# Patient Record
Sex: Male | Born: 1985 | Race: Black or African American | Hispanic: No | Marital: Single | Smoking: Never smoker
Health system: Southern US, Community
[De-identification: ages and names within clinical notes are randomized; demographics above are authoritative.]

## PROBLEM LIST (undated history)

## (undated) DIAGNOSIS — M109 Gout, unspecified: Secondary | ICD-10-CM

## (undated) HISTORY — PX: WISDOM TOOTH EXTRACTION: SHX21

## (undated) HISTORY — PX: NO PAST SURGERIES: SHX2092

---

## 2005-07-02 ENCOUNTER — Emergency Department: Payer: Self-pay | Admitting: Emergency Medicine

## 2005-12-11 ENCOUNTER — Emergency Department: Payer: Self-pay | Admitting: General Practice

## 2007-07-09 ENCOUNTER — Emergency Department: Payer: Self-pay | Admitting: Emergency Medicine

## 2008-03-15 ENCOUNTER — Emergency Department: Payer: Self-pay | Admitting: Emergency Medicine

## 2008-04-14 ENCOUNTER — Emergency Department: Payer: Self-pay | Admitting: Emergency Medicine

## 2008-04-19 ENCOUNTER — Emergency Department: Payer: Self-pay | Admitting: Emergency Medicine

## 2009-02-09 ENCOUNTER — Emergency Department: Payer: Self-pay | Admitting: Emergency Medicine

## 2009-08-18 IMAGING — CR DG KNEE COMPLETE 4+V*R*
1 series · 5 of 5 positions shown · non-contrast
Comparison: none

REASON FOR EXAM: inj
COMMENTS:   LMP: (Male)

PROCEDURE:     DXR - DXR KNEE RT COMP WITH OBLIQUES  - July 09, 2007 [DATE]
RESULT:     No fracture, dislocation or other acute bony abnormality is
identified. The knee joint space is well maintained. The patella is intact.

[Series 1: view not recorded · 0.17mm/px · 5 of 5 slices shown]
[im 1/5]
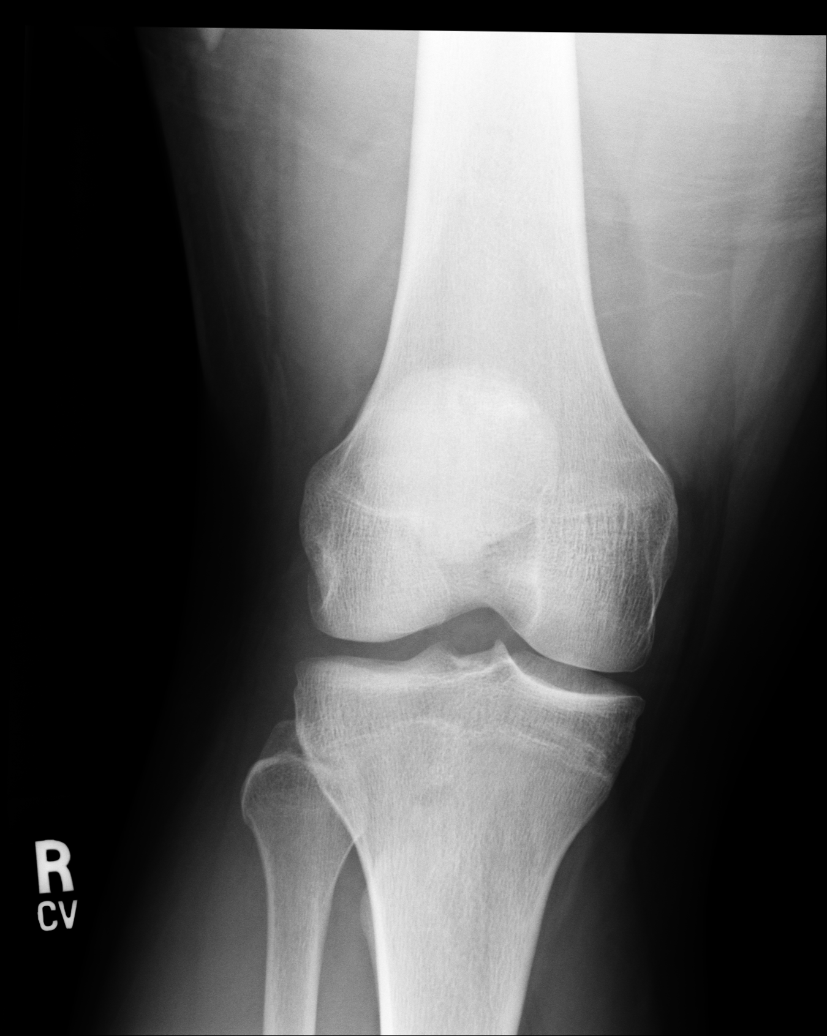
[im 2/5]
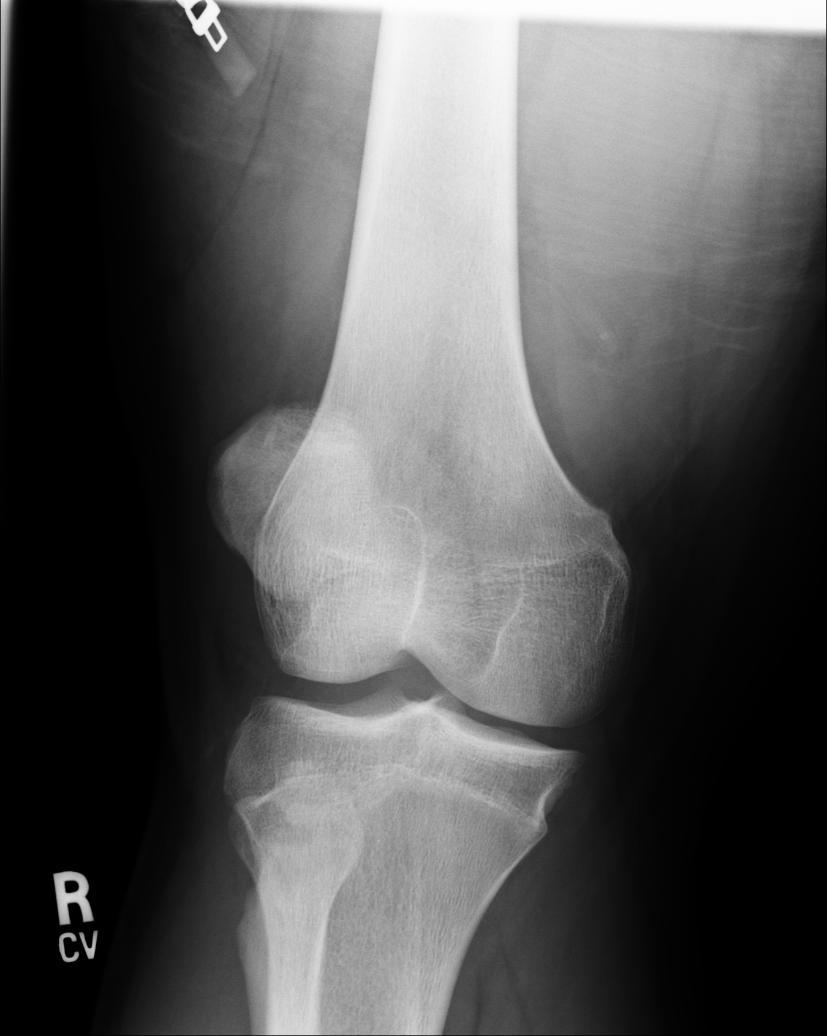
[im 3/5]
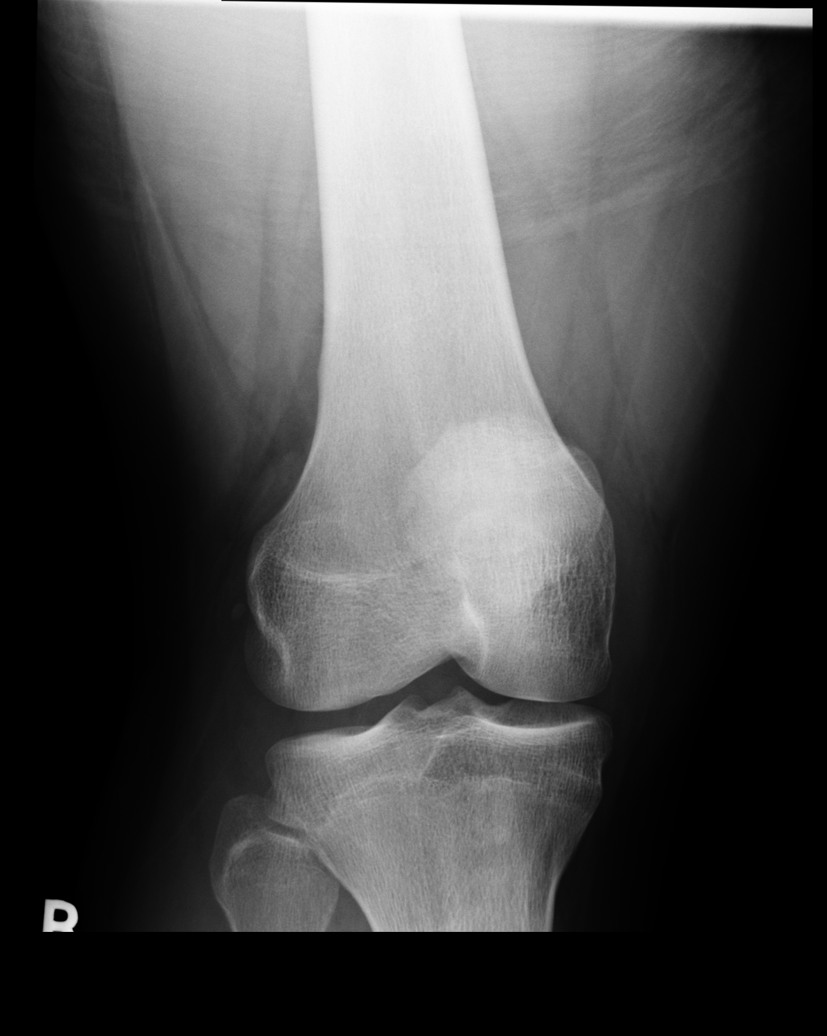
[im 4/5]
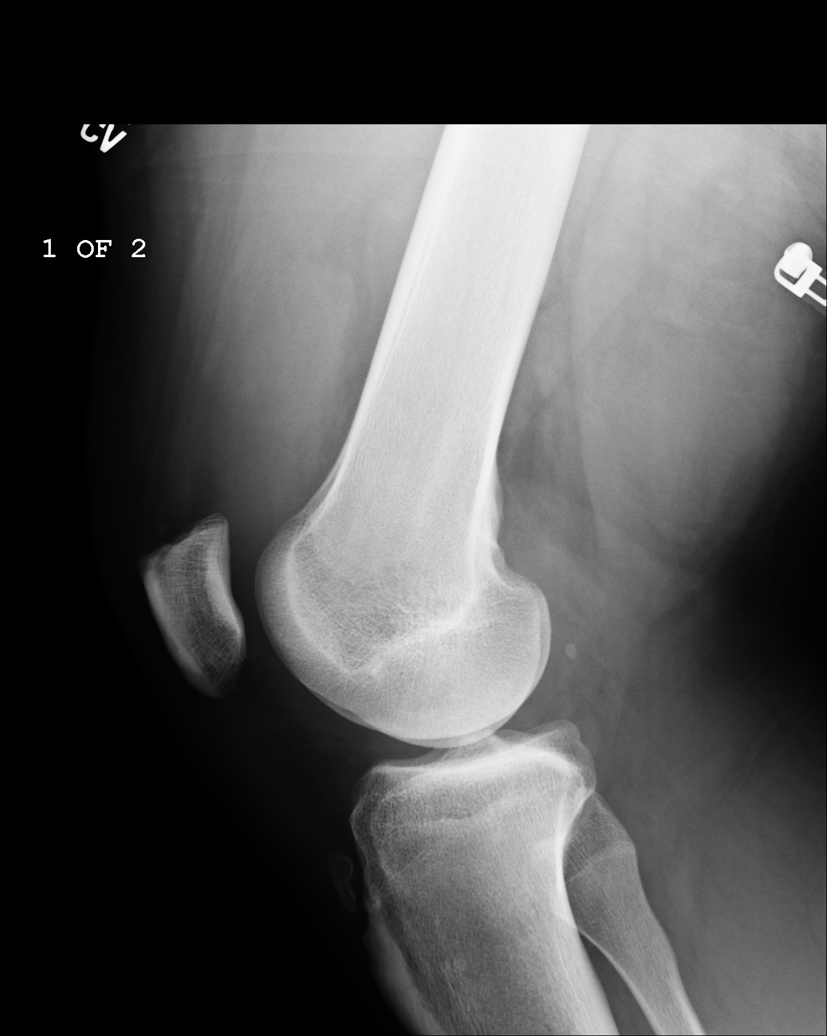
[im 5/5]
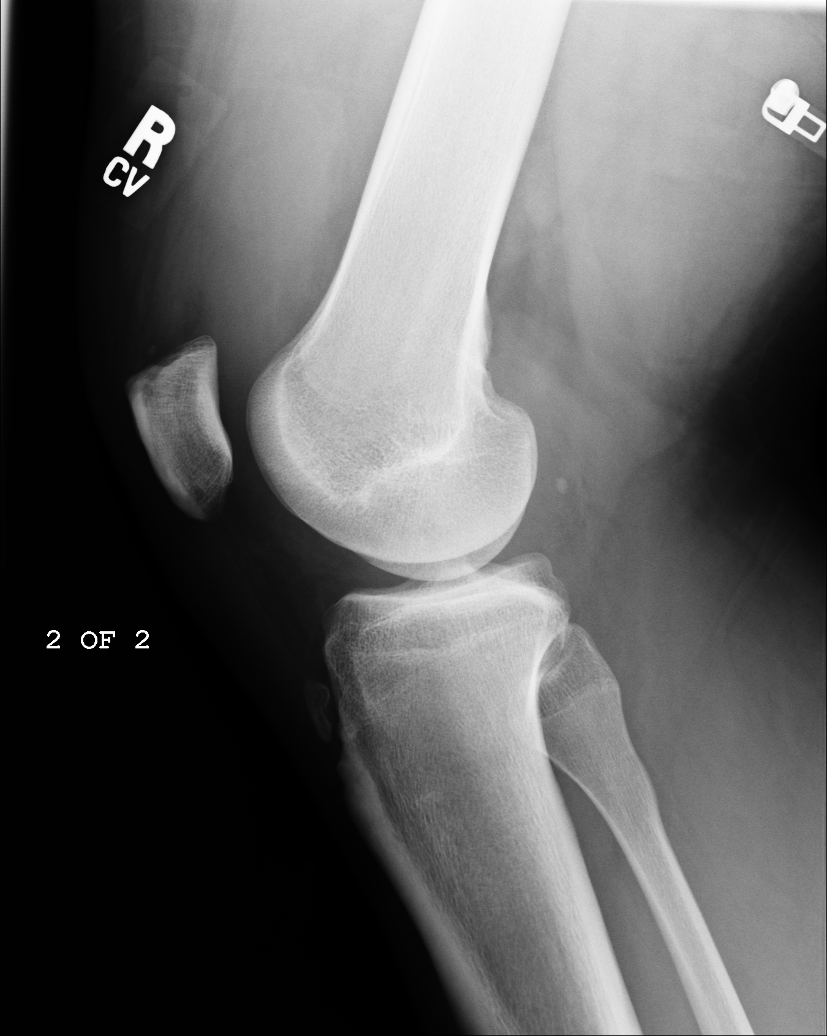

[5 of 5 positions shown; findings below may reference images not displayed]

IMPRESSION: 1.     No significant abnormalities are noted.

## 2010-02-08 ENCOUNTER — Emergency Department: Payer: Self-pay | Admitting: Emergency Medicine

## 2010-04-10 ENCOUNTER — Emergency Department: Payer: Self-pay | Admitting: Internal Medicine

## 2010-04-11 ENCOUNTER — Emergency Department (HOSPITAL_COMMUNITY): Admission: EM | Admit: 2010-04-11 | Discharge: 2010-04-11 | Payer: Self-pay | Admitting: Emergency Medicine

## 2010-05-10 ENCOUNTER — Emergency Department: Payer: Self-pay | Admitting: Emergency Medicine

## 2010-06-02 ENCOUNTER — Emergency Department: Payer: Self-pay | Admitting: Emergency Medicine

## 2011-02-15 ENCOUNTER — Emergency Department: Payer: Self-pay | Admitting: Emergency Medicine

## 2012-03-15 ENCOUNTER — Emergency Department: Payer: Self-pay | Admitting: Emergency Medicine
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
## 2012-06-20 ENCOUNTER — Emergency Department: Payer: Self-pay | Admitting: Emergency Medicine

## 2012-08-11 ENCOUNTER — Emergency Department: Payer: Self-pay | Admitting: Internal Medicine

## 2014-02-15 ENCOUNTER — Observation Stay: Payer: Self-pay | Admitting: Internal Medicine

## 2014-02-15 LAB — URINALYSIS, COMPLETE
Bilirubin,UR: NEGATIVE
Blood: NEGATIVE
Glucose,UR: NEGATIVE mg/dL (ref 0–75)
Ketone: NEGATIVE
Leukocyte Esterase: NEGATIVE
NITRITE: NEGATIVE
Ph: 6 (ref 4.5–8.0)
SQUAMOUS EPITHELIAL: NONE SEEN
Specific Gravity: 1.01 (ref 1.003–1.030)
WBC UR: NONE SEEN /HPF (ref 0–5)

## 2014-02-15 LAB — COMPREHENSIVE METABOLIC PANEL
ALBUMIN: 3.8 g/dL (ref 3.4–5.0)
ALK PHOS: 103 U/L
ANION GAP: 7 (ref 7–16)
BUN: 9 mg/dL (ref 7–18)
Bilirubin,Total: 1.1 mg/dL — ABNORMAL HIGH (ref 0.2–1.0)
Calcium, Total: 8.9 mg/dL (ref 8.5–10.1)
Chloride: 105 mmol/L (ref 98–107)
Co2: 28 mmol/L (ref 21–32)
Creatinine: 1.21 mg/dL (ref 0.60–1.30)
EGFR (African American): >60
EGFR (Non-African Amer.): >60
GLUCOSE: 102 mg/dL — AB (ref 65–99)
Osmolality: 278 (ref 275–301)
POTASSIUM: 3.4 mmol/L — AB (ref 3.5–5.1)
SGOT(AST): 37 U/L (ref 15–37)
SGPT (ALT): 47 U/L
Sodium: 140 mmol/L (ref 136–145)
Total Protein: 7.8 g/dL (ref 6.4–8.2)

## 2014-02-15 LAB — CBC
HCT: 45.8 % (ref 40.0–52.0)
HGB: 15.2 g/dL (ref 13.0–18.0)
MCH: 27.8 pg (ref 26.0–34.0)
MCHC: 33.2 g/dL (ref 32.0–36.0)
MCV: 84 fL (ref 80–100)
PLATELETS: 194 10*3/uL (ref 150–440)
RBC: 5.47 10*6/uL (ref 4.40–5.90)
RDW: 14.1 % (ref 11.5–14.5)
WBC: 6 10*3/uL (ref 3.8–10.6)

## 2014-02-15 LAB — DRUG SCREEN, URINE
Amphetamines, Ur Screen: NEGATIVE (ref ?–1000)
Barbiturates, Ur Screen: NEGATIVE (ref ?–200)
Benzodiazepine, Ur Scrn: NEGATIVE (ref ?–200)
CANNABINOID 50 NG, UR ~~LOC~~: POSITIVE (ref ?–50)
Cocaine Metabolite,Ur ~~LOC~~: NEGATIVE (ref ?–300)
MDMA (ECSTASY) UR SCREEN: NEGATIVE (ref ?–500)
METHADONE, UR SCREEN: NEGATIVE (ref ?–300)
OPIATE, UR SCREEN: NEGATIVE (ref ?–300)
Phencyclidine (PCP) Ur S: NEGATIVE (ref ?–25)
Tricyclic, Ur Screen: NEGATIVE (ref ?–1000)

## 2014-02-15 LAB — TROPONIN I

## 2014-02-16 LAB — CBC WITH DIFFERENTIAL/PLATELET
BASOS ABS: 0 10*3/uL (ref 0.0–0.1)
BASOS PCT: 0.2 %
EOS PCT: 0 %
Eosinophil #: 0 10*3/uL (ref 0.0–0.7)
HCT: 47.9 % (ref 40.0–52.0)
HGB: 15.8 g/dL (ref 13.0–18.0)
LYMPHS ABS: 0.9 10*3/uL — AB (ref 1.0–3.6)
LYMPHS PCT: 5.6 %
MCH: 27.5 pg (ref 26.0–34.0)
MCHC: 33 g/dL (ref 32.0–36.0)
MCV: 83 fL (ref 80–100)
MONO ABS: 0.5 x10 3/mm (ref 0.2–1.0)
Monocyte %: 3 %
NEUTROS PCT: 91.2 %
Neutrophil #: 14.3 10*3/uL — ABNORMAL HIGH (ref 1.4–6.5)
Platelet: 226 10*3/uL (ref 150–440)
RBC: 5.74 10*6/uL (ref 4.40–5.90)
RDW: 14.1 % (ref 11.5–14.5)
WBC: 15.6 10*3/uL — ABNORMAL HIGH (ref 3.8–10.6)

## 2014-02-16 LAB — BASIC METABOLIC PANEL
Anion Gap: 8 (ref 7–16)
BUN: 11 mg/dL (ref 7–18)
Calcium, Total: 9.2 mg/dL (ref 8.5–10.1)
Chloride: 109 mmol/L — ABNORMAL HIGH (ref 98–107)
Co2: 24 mmol/L (ref 21–32)
Creatinine: 1.21 mg/dL (ref 0.60–1.30)
EGFR (African American): >60
EGFR (Non-African Amer.): >60
Glucose: 126 mg/dL — ABNORMAL HIGH (ref 65–99)
Osmolality: 282 (ref 275–301)
Potassium: 3.9 mmol/L (ref 3.5–5.1)
Sodium: 141 mmol/L (ref 136–145)

## 2014-02-16 LAB — MAGNESIUM: Magnesium: 1.9 mg/dL

## 2014-02-17 LAB — EXPECTORATED SPUTUM ASSESSMENT W REFEX TO RESP CULTURE

## 2014-09-26 ENCOUNTER — Emergency Department: Payer: Self-pay | Admitting: Student

## 2014-11-15 NOTE — Discharge Summary (Signed)
PATIENT NAME:  Edward Burns, Edward Burns MR#:  [id number] DATE OF BIRTH:  1986/07/10  DATE OF ADMISSION:  02/15/2014 DATE OF DISCHARGE:  02/16/2014  FINAL DIAGNOSES:  1.  Acute respiratory failure, which resolved.  2.  Asthmatic bronchitis.  3.  Hypokalemia.   MEDICATIONS ON DISCHARGE: Include prednisone 5 mg 6 tablets day 1, 5 tablets day 2, 4 tablets day 3, 3 tablets day 4, 2 tablets day 5, 1 tablet day 6 and 7; albuterol CFC-free, 1 puff 4 times a day as needed for shortness of breath; doxycycline 100 mg every 12 hours for 9 more days.  FOLLOWUP: Follow up 2-4 weeks with a medical doctor, names and numbers given of 3 medical doctors in the community.   DIET: Regular diet, regular consistency.   ACTIVITY: As tolerated.   HOSPITAL COURSE: The patient was admitted on the 25th with a chief complaint of shortness of breath and cough. He was admitted with hypoxic acute respiratory failure with wheezing. He was started on IV Solu-Medrol and Zithromax.  LABORATORY AND RADIOLOGICAL DATA DURING THE HOSPITAL COURSE: Included a chest x-ray that was negative. Troponin negative. Glucose 102, BUN 9, creatinine 1.21, sodium 140, potassium 3.4, chloride 105, CO2 of 28, calcium 8.9, total bilirubin 1.1. Other liver function tests normal range. White blood cell count 6.0, hemoglobin and hematocrit 15.2 and 45.8, platelet count of 194,000. CT scan of the chest showed no evidence of pulmonary embolism. No other acute cardiopulmonary identified. Sputum culture holding for possible pathogen. Urine toxicology positive for cannabis. Urinalysis 30 mg/dL of protein. Magnesium 1.9. A repeat potassium 3.9. Repeat creatinine 1.2. White count upon discharge 15.6.  HOSPITAL COURSE PER PROBLEM LIST:  1.  For the patient's acute respiratory failure, this was documented by the admitting physician. Pulse oximetry was 95% with supplemental oxygen. The patient was unable to maintain adequate saturations without supplemental oxygen.  Pulse oximetry upon discharge 97% on room air. This had improved.  2.  Asthmatic bronchitis. No pneumonia seen on chest x-ray or CT scan. The patient had quite a bit of wheezing. 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 I advised him to stop smoking marijuana. I gave him a quick steroid taper, albuterol inhaler, and doxycycline. Patient was moving better air upon discharge, still had a slight expiratory wheeze at the bases, but overall improved and much better. Patient was stable for discharge home with a prednisone taper.  3.  Hypokalemia. This was replaced during the hospital course.   TIME SPENT ON DISCHARGE: 35 minutes.    ____________________________ Herschell Dimesichard J. Renae GlossWieting, MD rjw:sk D: 02/16/2014 14:00:37 ET T: 02/17/2014 00:42:49 ET JOB#: [id number]  cc: Herschell Dimesichard J. Renae GlossWieting, MD, <Dictator> Salley ScarletICHARD J Layson Bertsch MD ELECTRONICALLY SIGNED 02/18/2014 16:18

## 2014-11-15 NOTE — H&P (Signed)
PATIENT NAME:  Edward ChessmanCLARK, Khalik R MR#:  [id number] DATE OF BIRTH:  April 07, 1986  DATE OF ADMISSION:  02/15/2014  REFERRING PHYSICIAN: Dr. Daryel NovemberJonathan Williams  PRIMARY CARE PHYSICIAN: None.   CHIEF COMPLAINT: Shortness of breath, cough.   HISTORY OF PRESENT ILLNESS: This is a 29 year old male without significant past medical history, who presents with complaints of shortness of breath x4 days, cough, nonproductive, as well as reports occasional epistaxis from both nares over the last two days. Reports some chills at home, but denies any fever, any productive sputum, any hemoptysis as well. The patient also has significant wheezing in the Emergency Room, where he received 60 of prednisone without much relief. Received multiple nebulizer treatments, but despite that continued to have wheezing and requiring O2 via no nasal cannula as well. The patient's chest x-ray did not show any acute cardiopulmonary findings. The patient denies any history of such previous episodes. Denies any hemoptysis, calf tenderness, any long flight or any recent travel. As well, denies any sick contacts anyone else sick at home. No history of asthma or chronic obstructive pulmonary disease in the past. Hospitalist was requested to admit the patient for further evaluation.   PAST MEDICAL HISTORY: None.   PAST SURGICAL HISTORY: None.   ALLERGIES: No known drug allergies.   HOME MEDICATIONS: None.   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 He denies any tobacco use. He smokes marijuana 1 to 2 times a day. No alcohol. No IV drug abuse.   SOCIAL HISTORY: Denies any family history of sarcoidosis or lung disease in the family.   REVIEW OF SYSTEMS:  GENERAL: Denies fever, fatigue, weakness, weight gain, weight loss.  EYES: Denies blurry vision, double vision, inflammation.  ENT: Denies tinnitus, ear pain, hearing loss. Reports epistaxis.  RESPIRATORY: Reports cough, nonproductive. Denies hemoptysis or  chronic obstructive pulmonary disease.  CARDIOVASCULAR: Denies chest pain, orthopnea, palpitations, syncope.  GASTROINTESTINAL: Denies nausea, vomiting, diarrhea, abdominal pain, hematemesis. GENITOURINARY: Denies dysuria, hematuria. ENDOCRINE: Denies polyuria, polydipsia, heat or cold intolerance.  HEMATOLOGY: Denies anemia, easy bruising, bleeding diathesis.  INTEGUMENT: Denies acne, rash or skin lesion.  MUSCULOSKELETAL: Denies any arthritis, cramps, joint pain, gout.  NEUROLOGIC: Denies CVA, transient ischemic attack, tremors, vertigo, numbness, weakness.  PSYCHIATRIC: Denies anxiety, insomnia, or depression.   PHYSICAL EXAMINATION: VITAL SIGNS: Temperature 98, pulse 70, respiratory rate 20, blood pressure 153/102. Upon presentation, saturating 90% on room air.  GENERAL: Well-nourished male who looks comfortable in bed, in no apparent distress.  HEENT: Head atraumatic, normocephalic. Pupils equal and reactive to light. Pink conjunctivae. Anicteric sclerae. Moist oral mucosa.  NECK: Supple. No thyromegaly. No JVD.  CHEST: Good air entry bilaterally, with diffuse wheezing and rhonchi. No stridor.  CARDIOVASCULAR: S1, S2 heard. No rubs, murmurs, gallops.  ABDOMEN: Soft, nontender, nondistended. Bowel pulses present. EXTREMITIES: No edema, no clubbing, no cyanosis. Pedal pulses +2 bilaterally.  PSYCHIATRIC: Appropriate affect. Awake, alert x3. Intact judgment and insight.  NEUROLOGIC: Cranial nerves grossly intact. Motor 5/5. No focal deficits.  MUSCULOSKELETAL: No joint effusion or erythema.  SKIN: Normal skin turgor. Warm and dry.  PERTINENT LABORATORY DATA: Glucose 102, BUN 9, creatinine 1.21, sodium 140, potassium 3.4, chloride 105, CO2 28. Troponin less than 0.02. White blood cells 6, hemoglobin 15.2, hematocrit 45.8, platelets 195. Chest x-ray: No acute cardiopulmonary findings.   ASSESSMENT AND PLAN: 1.  Acute respiratory failure, hypoxic. The patient presents with significant  wheezing as well as hypoxia with nonproductive cough. Etiology is unclear so far. It might  be simple as acute bronchitis, but this would not explain his significant hypoxia. So the patient will be admitted for further work-up. We will keep him on oxygen as needed to keep his oxygen saturation above 95%. We will proceed with work-up of infectious etiology. We will obtain sputum cultures. We will check urine Legionella antigen as well. We will follow on the sputum cultures. The patient had traveled multiple times over the last few years to [place], so histoplasmosis is unlikely, but we will obtain CT chest with IV contrast to rule out pulmonary embolism. As far as further evaluation, we will consult pulmonary consult as well. Given he has epistaxis, we will check c-ANCA, will check urinalysis to rule out Wegener's disease as well. The patient does not have a history of asthma or chronic obstructive pulmonary disease, but given his significant wheezing and hypoxia, we will start him on DuoNebs every four hours, as-needed albuterol and IV Solu-Medrol as well.  2.  Deep vein thrombosis prophylaxis. Subcutaneous heparin.   CODE STATUS: Full code.   TOTAL TIME SPENT ON ADMISSION AND PATIENT CARE: 40 minutes.    ____________________________ Starleen Arms, MD dse:cg D: 02/15/2014 06:30:32 ET T: 02/15/2014 07:19:01 ET JOB#: [id number]  cc: Starleen Arms, MD, <Dictator> Aritzel Krusemark Teena Irani MD ELECTRONICALLY SIGNED 02/17/2014 7:19

## 2015-08-19 ENCOUNTER — Other Ambulatory Visit: Payer: Self-pay | Admitting: Family Medicine

## 2015-08-19 ENCOUNTER — Ambulatory Visit
Admission: RE | Admit: 2015-08-19 | Discharge: 2015-08-19 | Disposition: A | Discharge status: Home / Self Care | Payer: PRIVATE HEALTH INSURANCE | Source: Ambulatory Visit | Attending: Family Medicine | Admitting: Family Medicine

## 2015-08-19 DIAGNOSIS — Z0289 Encounter for other administrative examinations: Secondary | ICD-10-CM | POA: Insufficient documentation

## 2015-08-19 DIAGNOSIS — Z0189 Encounter for other specified special examinations: Secondary | ICD-10-CM | POA: Diagnosis present

## 2017-02-21 ENCOUNTER — Ambulatory Visit (INDEPENDENT_AMBULATORY_CARE_PROVIDER_SITE_OTHER): Payer: PRIVATE HEALTH INSURANCE

## 2017-02-21 ENCOUNTER — Ambulatory Visit
Admission: EM | Admit: 2017-02-21 | Discharge: 2017-02-21 | Disposition: A | Discharge status: Home / Self Care | Payer: PRIVATE HEALTH INSURANCE | Attending: Family Medicine | Admitting: Family Medicine

## 2017-02-21 ENCOUNTER — Encounter: Payer: Self-pay | Admitting: Emergency Medicine

## 2017-02-21 DIAGNOSIS — M109 Gout, unspecified: Secondary | ICD-10-CM

## 2017-02-21 DIAGNOSIS — M79671 Pain in right foot: Secondary | ICD-10-CM | POA: Diagnosis not present

## 2017-02-21 DIAGNOSIS — B353 Tinea pedis: Secondary | ICD-10-CM | POA: Diagnosis not present

## 2017-02-21 LAB — COMPREHENSIVE METABOLIC PANEL
ALK PHOS: 81 U/L (ref 38–126)
ALT: 48 U/L (ref 17–63)
ANION GAP: 8 (ref 5–15)
AST: 34 U/L (ref 15–41)
Albumin: 4.4 g/dL (ref 3.5–5.0)
BUN: 7 mg/dL (ref 6–20)
CALCIUM: 9.4 mg/dL (ref 8.9–10.3)
CO2: 27 mmol/L (ref 22–32)
CREATININE: 1.09 mg/dL (ref 0.61–1.24)
Chloride: 101 mmol/L (ref 101–111)
Glucose, Bld: 107 mg/dL — ABNORMAL HIGH (ref 65–99)
Potassium: 3.6 mmol/L (ref 3.5–5.1)
SODIUM: 136 mmol/L (ref 135–145)
Total Bilirubin: 1.3 mg/dL — ABNORMAL HIGH (ref 0.3–1.2)
Total Protein: 7.6 g/dL (ref 6.5–8.1)

## 2017-02-21 LAB — CBC WITH DIFFERENTIAL/PLATELET
Basophils Absolute: 0 10*3/uL (ref 0–0.1)
Basophils Relative: 1 %
EOS ABS: 0.1 10*3/uL (ref 0–0.7)
EOS PCT: 2 %
HCT: 44.8 % (ref 40.0–52.0)
HEMOGLOBIN: 14.8 g/dL (ref 13.0–18.0)
LYMPHS ABS: 1.8 10*3/uL (ref 1.0–3.6)
LYMPHS PCT: 30 %
MCH: 27.6 pg (ref 26.0–34.0)
MCHC: 33.2 g/dL (ref 32.0–36.0)
MCV: 83.4 fL (ref 80.0–100.0)
MONOS PCT: 9 %
Monocytes Absolute: 0.5 10*3/uL (ref 0.2–1.0)
Neutro Abs: 3.5 10*3/uL (ref 1.4–6.5)
Neutrophils Relative %: 58 %
PLATELETS: 195 10*3/uL (ref 150–440)
RBC: 5.37 MIL/uL (ref 4.40–5.90)
RDW: 14 % (ref 11.5–14.5)
WBC: 5.9 10*3/uL (ref 3.8–10.6)

## 2017-02-21 LAB — URIC ACID: URIC ACID, SERUM: 6.8 mg/dL (ref 4.4–7.6)

## 2017-02-21 MED ORDER — COLCHICINE 0.6 MG PO TABS
1.2000 mg | ORAL_TABLET | Freq: Once | ORAL | Status: AC
  Administered 2017-02-21: 1.2 mg via ORAL

## 2017-02-21 MED ORDER — COLCHICINE 0.6 MG PO TABS: 0.6000 mg | ORAL_TABLET | Freq: Every day | ORAL | 0 refills | Status: DC

## 2017-02-21 MED ORDER — MELOXICAM 15 MG PO TABS: 15.0000 mg | ORAL_TABLET | Freq: Every day | ORAL | 0 refills | Status: DC

## 2017-02-21 MED ORDER — METHYLPREDNISOLONE SODIUM SUCC 125 MG IJ SOLR
125.0000 mg | Freq: Once | INTRAMUSCULAR | Status: AC
  Administered 2017-02-21: 125 mg via INTRAMUSCULAR

## 2017-02-21 MED ORDER — KETOROLAC TROMETHAMINE 60 MG/2ML IM SOLN
60.0000 mg | Freq: Once | INTRAMUSCULAR | Status: AC
  Administered 2017-02-21: 60 mg via INTRAMUSCULAR

## 2017-02-21 MED ORDER — KETOCONAZOLE 2 % EX CREA: 1.0000 "application " | TOPICAL_CREAM | Freq: Two times a day (BID) | CUTANEOUS | 0 refills | Status: DC

## 2017-02-21 NOTE — ED Notes (Signed)
Patient shows no signs of adverse reaction to medication at this time.  

## 2017-02-21 NOTE — ED Triage Notes (Signed)
Patient c/o right 5th toe pain that started 2 days ago.  Patient denies injury.

## 2017-02-21 NOTE — ED Provider Notes (Signed)
[facility]    CSN: [id number] Arrival date & time: 02/21/17  1329     History   Chief Complaint Chief Complaint  Patient presents with  . Toe Pain    right 5th toe    HPI Edward Burns is a 31 y.o. male.   Patient's here because of pain in his fifth right toe. He has pain in the fifth metatarsal carpal phalangeal joint area. States it started about 2 days ago. No known history of trauma. He's on no medications known medical problems. No known drug allergies. He used to smoke. He has diabetes in both his father and mother's side. No history gout though in the family. He denies any surgeries or operations no known medical   The history is provided by the patient. No language interpreter was used.  Toe Pain  This is a new problem. The current episode started more than 2 days ago. The problem occurs constantly. The problem has been gradually worsening. Pertinent negatives include no chest pain, no abdominal pain, no headaches and no shortness of breath. Nothing aggravates the symptoms. Nothing relieves the symptoms. He has tried nothing for the symptoms. The treatment provided no relief.    History reviewed. No pertinent past medical history.  There are no active problems to display for this patient.   History reviewed. No pertinent surgical history.     Home Medications    Prior to Admission medications   Medication Sig Start Date End Date Taking? Authorizing Provider  colchicine 0.6 MG tablet Take 1 tablet (0.6 mg total) by mouth daily. 02/21/17   Hassan RowanWade, Tanina Barb, MD  ketoconazole (NIZORAL) 2 % cream Apply 1 application topically 2 (two) times daily. 02/21/17   Hassan RowanWade, Mychael Soots, MD  meloxicam (MOBIC) 15 MG tablet Take 1 tablet (15 mg total) by mouth daily. 02/21/17   Hassan RowanWade, Edwards Mckelvie, MD    Family History History reviewed. No pertinent family history.  Social History Social History  Substance Use Topics  . Smoking status: Former Games developermoker  . Smokeless tobacco:  Never Used  . Alcohol use Yes     Allergies   Patient has no known allergies.   Review of Systems Review of Systems  Respiratory: Negative for shortness of breath.   Cardiovascular: Negative for chest pain.  Gastrointestinal: Negative for abdominal pain.  Musculoskeletal: Positive for joint swelling and myalgias.  Skin: Negative.   Neurological: Negative for headaches.  All other systems reviewed and are negative.    Physical Exam Triage Vital Signs ED Triage Vitals  Enc Vitals Group     BP 02/21/17 1338 (!) 138/91     Pulse Rate 02/21/17 1338 69     Resp 02/21/17 1338 16     Temp 02/21/17 1338 98.4 F (36.9 C)     Temp Source 02/21/17 1338 Oral     SpO2 02/21/17 1338 100 %     Weight 02/21/17 1336 285 lb (129.3 kg)     Height 02/21/17 1336 6\' 1"  (1.854 m)     Head Circumference --      Peak Flow --      Pain Score 02/21/17 1336 9     Pain Loc --      Pain Edu? --      Excl. in GC? --    No data found.   Updated Vital Signs BP (!) 138/91 (BP Location: Left Arm)   Pulse 69   Temp 98.4 F (36.9 C) (Oral)   Resp 16  Ht 6\' 1"  (1.854 m)   Wt 285 lb (129.3 kg)   SpO2 100%   BMI 37.60 kg/m   Visual Acuity Right Eye Distance:   Left Eye Distance:   Bilateral Distance:    Right Eye Near:   Left Eye Near:    Bilateral Near:     Physical Exam  Constitutional: He is oriented to person, place, and time. He appears well-developed and well-nourished. No distress.  HENT:  Head: Normocephalic and atraumatic.  Right Ear: External ear normal.  Left Ear: External ear normal.  Mouth/Throat: Oropharynx is clear and moist.  Eyes: Pupils are equal, round, and reactive to light. EOM are normal.  Neck: Normal range of motion.  Pulmonary/Chest: Breath sounds normal.  Musculoskeletal: Normal range of motion. He exhibits edema and tenderness. He exhibits no deformity.  Neurological: He is alert and oriented to person, place, and time. No cranial nerve deficit.  Coordination normal.  Skin: Skin is warm. Rash noted. He is not diaphoretic. There is erythema.  Patient between the fourth and fifth toe has what looks like significant fungal infection but don't think this is related to the pain that he is having nor do I think that his foot is  infected  Psychiatric: He has a normal mood and affect.  Vitals reviewed.    UC Treatments / Results  Labs (all labs ordered are listed, but only abnormal results are displayed) Labs Reviewed  COMPREHENSIVE METABOLIC PANEL - Abnormal; Notable for the following:       Result Value   Glucose, Bld 107 (*)    Total Bilirubin 1.3 (*)    All other components within normal limits  CBC WITH DIFFERENTIAL/PLATELET  URIC ACID    EKG  EKG Interpretation None       Radiology Dg Foot Complete Right  Result Date: 02/21/2017 CLINICAL DATA:  Right fifth toe pain.  No known injury. EXAM: RIGHT FOOT COMPLETE - 3+ VIEW COMPARISON:  None. FINDINGS: There is no acute bony or joint abnormality. No evidence of arthropathy. Mild hallux valgus noted. Soft tissues appear normal. IMPRESSION: No acute abnormality or finding to explain the patient's symptoms. Hallux valgus. Electronically Signed   By: Drusilla Kanner M.D.   On: 02/21/2017 14:37    Procedures Procedures (including critical care time)  Medications Ordered in UC Medications  colchicine tablet 1.2 mg (1.2 mg Oral Given 02/21/17 1436)  methylPREDNISolone sodium succinate (SOLU-MEDROL) 125 mg/2 mL injection 125 mg (125 mg Intramuscular Given 02/21/17 1439)  ketorolac (TORADOL) injection 60 mg (60 mg Intramuscular Given 02/21/17 1438)     Initial Impression / Assessment and Plan / UC Course  I have reviewed the triage vital signs and the nursing notes.  Pertinent labs & imaging results that were available during my care of the patient were reviewed by me and considered in my medical decision making (see chart for details).  Clinical Course as of Feb 21 1502    Tue Feb 21, 2017  1448 Uric Acid, Serum: 6.8 [EW]    Clinical Course User Index [EW] Hassan Rowan, MD    Is felt the patient may have gout will get a uric acid and CMP CBC x-ray of the right foot, offer him colchicine 1.2 mg by mouth Solu-Medrol 125 and Toradol 60 mg IM   Treat for appears be elevated uric acid with colchicine 0.6 mg daily Mobic 15 mg daily avoid high-protein diet follow-up PCP as needed and for the fungal infection between the toenails Nizoral cream  twice a day for the next 2-3 weeks   October given for Wednesday and Thursday he can return back to work on Friday Final Clinical Impressions(s) / UC Diagnoses   Final diagnoses:  Acute gout of foot, unspecified cause, unspecified laterality  Tinea pedis of right foot    New Prescriptions New Prescriptions   COLCHICINE 0.6 MG TABLET    Take 1 tablet (0.6 mg total) by mouth daily.   KETOCONAZOLE (NIZORAL) 2 % CREAM    Apply 1 application topically 2 (two) times daily.   MELOXICAM (MOBIC) 15 MG TABLET    Take 1 tablet (15 mg total) by mouth daily.    Note: This dictation was prepared with Dragon dictation along with smaller phrase technology. Any transcriptional errors that result from this process are unintentional.   Hassan RowanWade, Parisa Pinela, MD 02/21/17 [phone]

## 2017-03-30 ENCOUNTER — Ambulatory Visit
Admission: EM | Admit: 2017-03-30 | Discharge: 2017-03-30 | Disposition: A | Discharge status: Home / Self Care | Payer: PRIVATE HEALTH INSURANCE | Attending: Family Medicine | Admitting: Family Medicine

## 2017-03-30 DIAGNOSIS — B353 Tinea pedis: Secondary | ICD-10-CM

## 2017-03-30 DIAGNOSIS — M10071 Idiopathic gout, right ankle and foot: Secondary | ICD-10-CM

## 2017-03-30 HISTORY — DX: Gout, unspecified: M10.9

## 2017-03-30 MED ORDER — KETOCONAZOLE 2 % EX CREA: 1.0000 "application " | TOPICAL_CREAM | Freq: Two times a day (BID) | CUTANEOUS | 0 refills | Status: DC

## 2017-03-30 MED ORDER — COLCHICINE 0.6 MG PO TABS: 1.2000 mg | ORAL_TABLET | Freq: Once | ORAL | Status: DC

## 2017-03-30 MED ORDER — MELOXICAM 15 MG PO TABS: 15.0000 mg | ORAL_TABLET | Freq: Every day | ORAL | 0 refills | Status: DC

## 2017-03-30 MED ORDER — COLCHICINE 0.6 MG PO TABS: 0.6000 mg | ORAL_TABLET | Freq: Every day | ORAL | 0 refills | Status: DC

## 2017-03-30 MED ORDER — PREDNISONE 10 MG (21) PO TBPK: ORAL_TABLET | ORAL | 0 refills | Status: DC

## 2017-03-30 MED ORDER — COLCHICINE 0.6 MG PO TABS
1.2000 mg | ORAL_TABLET | Freq: Once | ORAL | Status: AC
  Administered 2017-03-30: 1.2 mg via ORAL

## 2017-03-30 NOTE — ED Provider Notes (Signed)
[facility]    CSN: [id number] Arrival date & time: 03/30/17  1133     History   Chief Complaint Chief Complaint  Patient presents with  . Toe Pain    HPI Rosalita ChessmanChristopher R Cozzens is a 31 y.o. male.   Patient is a 31 year old black male who was seen about 6 weeks ago diagnosis of gout and fungal infection. Unfortunately because of the cost of medication not get medications. Treatment he received in the urgent care helped the gout but eventually the pain in the joints returned and the rash between his toes became worse because of those symptoms in size he came back in to be seen and evaluated. His history diabetes hypertension the family no previous surgery history. He is a former smoker no known drug allergies   The history is provided by the patient. No language interpreter was used.  Toe Pain  This is a recurrent problem. The current episode started more than 2 days ago. The problem occurs constantly. The problem has been gradually worsening. Pertinent negatives include no chest pain, no abdominal pain, no headaches and no shortness of breath. Nothing aggravates the symptoms. Nothing relieves the symptoms. He has tried nothing for the symptoms. The treatment provided no relief.    Past Medical History:  Diagnosis Date  . Gout     There are no active problems to display for this patient.   History reviewed. No pertinent surgical history.     Home Medications    Prior to Admission medications   Medication Sig Start Date End Date Taking? Authorizing Provider  colchicine 0.6 MG tablet Take 1 tablet (0.6 mg total) by mouth daily. May WUJ:WJXBJYNuse:Colcrys 03/30/17   Hassan RowanWade, Angeliyah Kirkey, MD  ketoconazole (NIZORAL) 2 % cream Apply 1 application topically 2 (two) times daily. 03/30/17   Hassan RowanWade, Vantasia Pinkney, MD  meloxicam (MOBIC) 15 MG tablet Take 1 tablet (15 mg total) by mouth daily. 03/30/17   Hassan RowanWade, Else Habermann, MD  predniSONE (STERAPRED UNI-PAK 21 TAB) 10 MG (21) TBPK tablet Sig 6 tablet day 1, 5  tablets day 2, 4 tablets day 3,,3tablets day 4, 2 tablets day 5, 1 tablet day 6 take all tablets orally 03/30/17   Hassan RowanWade, Khambrel Amsden, MD    Family History History reviewed. No pertinent family history.  Social History Social History  Substance Use Topics  . Smoking status: Former Games developermoker  . Smokeless tobacco: Never Used  . Alcohol use Yes     Allergies   Patient has no known allergies.   Review of Systems Review of Systems  Respiratory: Negative for shortness of breath.   Cardiovascular: Negative for chest pain.  Gastrointestinal: Negative for abdominal pain.  Musculoskeletal: Positive for arthralgias, gait problem, joint swelling and myalgias.  Skin: Positive for rash.  Neurological: Negative for headaches.  All other systems reviewed and are negative.    Physical Exam Triage Vital Signs ED Triage Vitals  Enc Vitals Group     BP 03/30/17 1156 (!) 118/99     Pulse Rate 03/30/17 1156 66     Resp 03/30/17 1156 18     Temp 03/30/17 1156 97.8 F (36.6 C)     Temp Source 03/30/17 1156 Oral     SpO2 03/30/17 1156 100 %     Weight 03/30/17 1157 285 lb (129.3 kg)     Height 03/30/17 1157 6\' 1"  (1.854 m)     Head Circumference --      Peak Flow --      Pain  Score 03/30/17 1157 10     Pain Loc --      Pain Edu? --      Excl. in [place]? --    No data found.   Updated Vital Signs BP (!) 118/99 (BP Location: Right Arm)   Pulse 66   Temp 97.8 F (36.6 C) (Oral)   Resp 18   Ht  (1.854 m)   Wt 285 lb (129.3 kg)   SpO2 100%   BMI 37.60 kg/m   Visual Acuity Right Eye Distance:   Left Eye Distance:   Bilateral Distance:    Right Eye Near:   Left Eye Near:    Bilateral Near:     Physical Exam  Constitutional: He is oriented to person, place, and time. He appears well-developed and well-nourished.  HENT:  Head: Normocephalic and atraumatic.  Eyes: Pupils are equal, round, and reactive to light.  Neck: Normal range of motion.  Pulmonary/Chest: Effort normal.    Musculoskeletal: He exhibits tenderness.       Right foot: There is tenderness, bony tenderness and swelling.       Feet:  Pain present at the MP joint  Neurological: He is alert and oriented to person, place, and time.  Skin: Skin is warm. There is erythema.  Fungal infection right foot between the fourth and fifth toes  Psychiatric: He has a normal mood and affect.  Vitals reviewed.    UC Treatments / Results  Labs (all labs ordered are listed, but only abnormal results are displayed) Labs Reviewed - No data to display  EKG  EKG Interpretation None       Radiology No results found.  Procedures Procedures (including critical care time)  Medications Ordered in UC Medications  colchicine tablet 1.2 mg (1.2 mg Oral Given 03/30/17 1258)     Initial Impression / Assessment and Plan / UC Course  I have reviewed the triage vital signs and the nursing notes.  Pertinent labs & imaging results that were available during my care of the patient were reviewed by me and considered in my medical decision making (see chart for details).    We're going to renew his Coke wrist was components found for him appropriately reduce the cost will also add 6 day course prednisone will not give him a shot of prednisone here but he was given 1.2 of Coke seen while here. Place on Mobic 15 mg 1 tablet a day and also place a Nizoral cream for the fungal infection between his toes   Final Clinical Impressions(s) / UC Diagnoses   Final diagnoses:  Acute idiopathic gout involving toe of right foot  Tinea pedis of right foot    New Prescriptions Discharge Medication List as of 03/30/2017 12:57 PM    START taking these medications   Details  predniSONE (STERAPRED UNI-PAK 21 TAB) 10 MG (21) TBPK tablet Sig 6 tablet day 1, 5 tablets day 2, 4 tablets day 3,,3tablets day 4, 2 tablets day 5, 1 tablet day 6 take all tablets orally, Normal       Note: This dictation was prepared with Dragon  dictation along with smaller phrase technology. Any transcriptional errors that result from this process are unintentional. Controlled Substance Prescriptions [place] Controlled Substance Registry consulted? Not Applicable   Hassan Rowan, MD 03/30/17 1310

## 2017-03-30 NOTE — ED Triage Notes (Addendum)
Pt reports he was prescribed medication for gout but when he went to the pharmacy he found out the medication cost $370 for all 3 meds that were prescribed. Good RX card given in triage but pt needs work note from Dr. For today

## 2017-12-24 ENCOUNTER — Other Ambulatory Visit: Payer: Self-pay

## 2017-12-24 ENCOUNTER — Ambulatory Visit
Admission: EM | Admit: 2017-12-24 | Discharge: 2017-12-24 | Disposition: A | Discharge status: Home / Self Care | Payer: Self-pay | Attending: Orthopedic Surgery | Admitting: Orthopedic Surgery

## 2017-12-24 ENCOUNTER — Encounter: Payer: Self-pay | Admitting: Gynecology

## 2017-12-24 DIAGNOSIS — M109 Gout, unspecified: Secondary | ICD-10-CM

## 2017-12-24 MED ORDER — METHYLPREDNISOLONE 4 MG PO TBPK: ORAL_TABLET | ORAL | 0 refills | Status: DC

## 2017-12-24 NOTE — ED Triage Notes (Signed)
Patient c/o right foot gout flared up. Per patient very  painful

## 2017-12-24 NOTE — ED Provider Notes (Signed)
[facility]    CSN: [id number] Arrival date & time: 12/24/17  [id number]     History   Chief Complaint Chief Complaint  Patient presents with  . Foot Pain    HPI Edward Burns is a 32 y.o. male.   Presents to the urgent care facility for evaluation of gout flareup to the right foot.  Patient states the right fifth toe has been red hot and very tender to palpation.  Symptoms have been present for a few days.  He has had intermittent flareups over the last year with gout.  States he has been diagnosed with gout via blood testing.  Last visit patient was treated with Medrol Dosepak which resolved his gout flareup.  He denies any recent trauma or injury.  No fevers.  No drainage throughout the right foot.  He has been taking Tylenol with no improvement.  Patient states he is recently eaten around red meat.  HPI  Past Medical History:  Diagnosis Date  . Gout     There are no active problems to display for this patient.   History reviewed. No pertinent surgical history.     Home Medications    Prior to Admission medications   Medication Sig Start Date End Date Taking? Authorizing Provider  ketoconazole (NIZORAL) 2 % cream Apply 1 application topically 2 (two) times daily. 03/30/17  Yes Hassan RowanWade, Eugene, MD  colchicine 0.6 MG tablet Take 1 tablet (0.6 mg total) by mouth daily. May AOZ:HYQMVHQuse:Colcrys 03/30/17   Hassan RowanWade, Eugene, MD  meloxicam (MOBIC) 15 MG tablet Take 1 tablet (15 mg total) by mouth daily. 03/30/17   Hassan RowanWade, Eugene, MD  methylPREDNISolone (MEDROL DOSEPAK) 4 MG TBPK tablet Take as directed 12/24/17   Evon SlackGaines, Thomas C, PA-C  predniSONE (STERAPRED UNI-PAK 21 TAB) 10 MG (21) TBPK tablet Sig 6 tablet day 1, 5 tablets day 2, 4 tablets day 3,,3tablets day 4, 2 tablets day 5, 1 tablet day 6 take all tablets orally 03/30/17   Hassan RowanWade, Eugene, MD    Family History Family History  Problem Relation Age of Onset  . Diabetes Mother   . Hypertension Father   . Diabetes Father     Social  History Social History   Tobacco Use  . Smoking status: Former Games developermoker  . Smokeless tobacco: Never Used  Substance Use Topics  . Alcohol use: Yes  . Drug use: Not on file     Allergies   Patient has no known allergies.   Review of Systems Review of Systems  Constitutional: Negative for fever.  Respiratory: Negative for shortness of breath.   Cardiovascular: Negative for chest pain.  Musculoskeletal: Positive for arthralgias and gait problem. Negative for myalgias and neck pain.  Skin: Negative for rash and wound.     Physical Exam Triage Vital Signs ED Triage Vitals  Enc Vitals Group     BP 12/24/17 0851 (!) 138/93     Pulse Rate 12/24/17 0851 71     Resp 12/24/17 0851 18     Temp 12/24/17 0851 98.2 F (36.8 C)     Temp Source 12/24/17 0851 Oral     SpO2 12/24/17 0851 99 %     Weight 12/24/17 0853 285 lb (129.3 kg)     Height 12/24/17 0853 6\' 1"  (1.854 m)     Head Circumference --      Peak Flow --      Pain Score 12/24/17 0853 9     Pain Loc --  Pain Edu? --      Excl. in [place]? --    No data found.  Updated Vital Signs BP (!) 138/93 (BP Location: Left Arm)   Pulse 71   Temp 98.2 F (36.8 C) (Oral)   Resp 18   Ht 6\' 1"  (1.854 m)   Wt 285 lb (129.3 kg)   SpO2 99%   BMI 37.60 kg/m   Visual Acuity Right Eye Distance:   Left Eye Distance:   Bilateral Distance:    Right Eye Near:   Left Eye Near:    Bilateral Near:     Physical Exam  Constitutional: He is oriented to person, place, and time. He appears well-developed and well-nourished.  HENT:  Head: Normocephalic and atraumatic.  Eyes: Conjunctivae are normal.  Neck: Normal range of motion.  Cardiovascular: Normal rate.  Pulmonary/Chest: Effort normal. No respiratory distress.  Musculoskeletal: Normal range of motion.  Right foot shows mild swelling warmth and redness as well as tenderness to the base of the right fifth toe.  Mild moisture in between the fourth and fifth toe but no sign of  abscess formation or cellulitis.  There is no drainage.  Toe is tender to palpation along the dorsal aspect.  No cellulitis or streaking.  Neurological: He is alert and oriented to person, place, and time.  Skin: Skin is warm. No rash noted.  Psychiatric: He has a normal mood and affect. His behavior is normal. Thought content normal.     UC Treatments / Results  Labs (all labs ordered are listed, but only abnormal results are displayed) Labs Reviewed - No data to display  EKG None  Radiology No results found.  Procedures Procedures (including critical care time)  Medications Ordered in UC Medications - No data to display  Initial Impression / Assessment and Plan / UC Course  I have reviewed the triage vital signs and the nursing notes.  Pertinent labs & imaging results that were available during my care of the patient were reviewed by me and considered in my medical decision making (see chart for details).     32 year old male with history of gout states he has had a gout flareup over the last few days.  He was started on a Medrol Dosepak.  He is noticed to have some moisture between the fourth and fifth toes and have encouraged him to try to keep this area as dry as possible to prevent any skin breakdown.  If any increasing pain swelling or redness patient is return to the clinic.  Patient is educated on a low purine diet. Final Clinical Impressions(s) / UC Diagnoses   Final diagnoses:  Acute gout of right foot, unspecified cause   Discharge Instructions   None    ED Prescriptions    Medication Sig Dispense Auth. Provider   methylPREDNISolone (MEDROL DOSEPAK) 4 MG TBPK tablet Take as directed 21 tablet Willeen Cass, [place] 12/24/17 [phone]

## 2019-03-25 ENCOUNTER — Emergency Department
Admission: EM | Admit: 2019-03-25 | Discharge: 2019-03-25 | Disposition: A | Discharge status: Home / Self Care | Payer: Self-pay | Attending: Emergency Medicine | Admitting: Emergency Medicine

## 2019-03-25 ENCOUNTER — Other Ambulatory Visit: Payer: Self-pay

## 2019-03-25 DIAGNOSIS — Y999 Unspecified external cause status: Secondary | ICD-10-CM | POA: Insufficient documentation

## 2019-03-25 DIAGNOSIS — Z87891 Personal history of nicotine dependence: Secondary | ICD-10-CM | POA: Insufficient documentation

## 2019-03-25 DIAGNOSIS — Y929 Unspecified place or not applicable: Secondary | ICD-10-CM | POA: Insufficient documentation

## 2019-03-25 DIAGNOSIS — Y9389 Activity, other specified: Secondary | ICD-10-CM | POA: Insufficient documentation

## 2019-03-25 DIAGNOSIS — S39012A Strain of muscle, fascia and tendon of lower back, initial encounter: Secondary | ICD-10-CM | POA: Insufficient documentation

## 2019-03-25 DIAGNOSIS — X500XXA Overexertion from strenuous movement or load, initial encounter: Secondary | ICD-10-CM | POA: Insufficient documentation

## 2019-03-25 MED ORDER — KETOROLAC TROMETHAMINE 30 MG/ML IJ SOLN
30.0000 mg | Freq: Once | INTRAMUSCULAR | Status: AC
  Administered 2019-03-25: 30 mg via INTRAMUSCULAR
  Filled 2019-03-25: qty 1

## 2019-03-25 MED ORDER — METHOCARBAMOL 500 MG PO TABS: ORAL_TABLET | ORAL | 0 refills | Status: DC

## 2019-03-25 MED ORDER — NAPROXEN 500 MG PO TABS: 500.0000 mg | ORAL_TABLET | Freq: Two times a day (BID) | ORAL | 0 refills | Status: DC

## 2019-03-25 NOTE — ED Triage Notes (Signed)
Patient reports for approximately 2 days having muscle pain down left side after lifting.

## 2019-03-25 NOTE — ED Provider Notes (Signed)
[facility] Emergency Department Provider Note  ____________________________________________   First MD Initiated Contact with Patient 03/25/19 [phone]     (approximate)  I have reviewed the triage vital signs and the nursing notes.   HISTORY  Chief Complaint Muscle Pain   HPI Edward Burns is a 33 y.o. male presents to the ED with complaint of left-sided low back pain.  Patient states that he lifted a trash can 2 days ago and moved it near a trash bin.  Since that time he is continued to have left lower back pain.  He has taken over-the-counter ibuprofen and Tylenol without any relief.  He denies any urinary symptoms, incontinence of bowel or bladder.  Patient continues to ambulate without assistance.  He rates pain as 10 out of 10.       Past Medical History:  Diagnosis Date  . Gout     There are no active problems to display for this patient.   No past surgical history on file.  Prior to Admission medications   Medication Sig Start Date End Date Taking? Authorizing Provider  methocarbamol (ROBAXIN) 500 MG tablet 1-2 tabs every 6 hours prn muscle spasms 03/25/19   Letitia Neri L, PA-C  naproxen (NAPROSYN) 500 MG tablet Take 1 tablet (500 mg total) by mouth 2 (two) times daily with a meal. 03/25/19   Johnn Hai, PA-C    Allergies Patient has no known allergies.  Family History  Problem Relation Age of Onset  . Diabetes Mother   . Hypertension Father   . Diabetes Father     Social History Social History   Tobacco Use  . Smoking status: Former Research scientist (life sciences)  . Smokeless tobacco: Never Used  Substance Use Topics  . Alcohol use: Yes  . Drug use: Not on file    Review of Systems Constitutional: No fever/chills Eyes: No visual changes. ENT: No sore throat. Cardiovascular: Denies chest pain. Respiratory: Denies shortness of breath. Gastrointestinal: No abdominal pain.  No nausea, no vomiting.  Genitourinary: Negative for  dysuria. Musculoskeletal: Positive left lower back pain. Skin: Negative for rash. Neurological: Negative for headaches, focal weakness or numbness. ____________________________________________   PHYSICAL EXAM:  VITAL SIGNS: ED Triage Vitals  Enc Vitals Group     BP 03/25/19 0504 (!) 145/70     Pulse Rate 03/25/19 0504 72     Resp 03/25/19 0504 20     Temp 03/25/19 0504 98.3 F (36.8 C)     Temp Source 03/25/19 0504 Oral     SpO2 --      Weight 03/25/19 0505 270 lb (122.5 kg)     Height 03/25/19 0502 6' (1.829 m)     Head Circumference --      Peak Flow --      Pain Score 03/25/19 0502 10     Pain Loc --      Pain Edu? --      Excl. in [place]? --     Constitutional: Alert and oriented. Well appearing and in no acute distress. Eyes: Conjunctivae are normal.  Head: Atraumatic. Neck: No stridor.   Cardiovascular: Normal rate, regular rhythm. Grossly normal heart sounds.  Good peripheral circulation. Respiratory: Normal respiratory effort.  No retractions. Lungs CTAB. Gastrointestinal: Soft and nontender. No distention.  No CVA tenderness. Musculoskeletal: Examination of the lumbar spine there is no point tenderness to the vertebral bodies.  There is moderate tenderness on palpation of the left paravertebral muscles.  Right paravertebral muscles are  nontender.  Range of motion is slow and guarded.  Patient is able to ambulate without any assistance.  Muscle strength bilaterally. Neurologic:  Normal speech and language. No gross focal neurologic deficits are appreciated. No gait instability. Skin:  Skin is warm, dry and intact.  Psychiatric: Mood and affect are normal. Speech and behavior are normal.  ____________________________________________   LABS (all labs ordered are listed, but only abnormal results are displayed)  Labs Reviewed - No data to display   PROCEDURES  Procedure(s) performed (including Critical Care):  Procedures    ____________________________________________   INITIAL IMPRESSION / ASSESSMENT AND PLAN / ED COURSE  As part of my medical decision making, I reviewed the following data within the electronic MEDICAL RECORD NUMBER Notes from prior ED visits and [place] Controlled Substance Database  33 year old male presents to the ED with left-sided low back pain after lifting a trash can 2 days ago.  Over-the-counter medication has not helped.  Physical exam was unremarkable and consistent with a lumbar strain.  Patient was given Toradol 30 mg IM in the ED.  He was discharged with a prescription for naproxen 500 mg twice daily and methocarbamol 500 mg 1 or 2 tablets every 6 hours as needed for muscle spasms.  He is aware that he should not be taking the muscle relaxant while working and was given a note to remain out of work for 2 days.  He [facility]CP [facility] if any continued problems.  ____________________________________________   FINAL CLINICAL IMPRESSION(S) / ED DIAGNOSES  Final diagnoses:  Acute myofascial strain of lumbar region, initial encounter     ED Discharge Orders         Ordered    naproxen (NAPROSYN) 500 MG tablet  2 times daily with meals     03/25/19 0715    methocarbamol (ROBAXIN) 500 MG tablet     03/25/19 0715           Note:  This document was prepared using Dragon voice recognition software and may include unintentional dictation errors.    Tommi RumpsSummers,  L, PA-C 03/25/19 [id number]    Shaune PollackIsaacs, [place], MD 03/25/19 [phone]

## 2019-03-25 NOTE — ED Notes (Signed)
See triage note  Presents with muscle pain to left side  States he lifted a trash can 2 days ago  States pain is mainly to left alteral rib area

## 2019-03-25 NOTE — Discharge Instructions (Signed)
Follow-up with your primary care provider or can no clinic acute care if any continued problems.  Use ice or heat to your muscles as needed for discomfort.  Begin taking the naproxen 500 mg twice daily with food for inflammation.  Tylenol can be taken with this medication if additional pain medication as needed.  Do not take ibuprofen with this medication.  Methocarbamol 500 mg 1 or 2 every 6 hours as needed for muscle spasms.  This medication could cause drowsiness and increase your risk for injury.  Do not drive or operate machinery while taking this medication.  When you return to work you may take the medication prior to going to bed.

## 2019-08-02 ENCOUNTER — Ambulatory Visit
Admission: EM | Admit: 2019-08-02 | Discharge: 2019-08-02 | Disposition: A | Discharge status: Home / Self Care | Payer: Self-pay | Attending: Family Medicine | Admitting: Family Medicine

## 2019-08-02 ENCOUNTER — Encounter: Payer: Self-pay | Admitting: Emergency Medicine

## 2019-08-02 ENCOUNTER — Other Ambulatory Visit: Payer: Self-pay

## 2019-08-02 DIAGNOSIS — H00014 Hordeolum externum left upper eyelid: Secondary | ICD-10-CM

## 2019-08-02 NOTE — Discharge Instructions (Signed)
Warm moist compresses for 10-15 minutes every 2-4 hours

## 2019-08-02 NOTE — ED Triage Notes (Signed)
Pt c/o left eye swelling, pain and "lump". Started about a week ago, swelling started today when he woke up. He states it does not hurt all the time.

## 2019-08-04 NOTE — ED Provider Notes (Signed)
[facility]    CSN: [id number] Arrival date & time: 08/02/19  1058      History   Chief Complaint Chief Complaint  Patient presents with  . Eye Problem    left    HPI Edward Burns is a 34 y.o. male.   34 yo male with a c/o left upper eyelid red bump and pain to the area for the past week. States he's been putting warm compresses. Denies any injuries, eye drainage, fevers, chills.    Eye Problem   Past Medical History:  Diagnosis Date  . Gout     There are no problems to display for this patient.   Past Surgical History:  Procedure Laterality Date  . NO PAST SURGERIES         Home Medications    Prior to Admission medications   Medication Sig Start Date End Date Taking? Authorizing Provider  methocarbamol (ROBAXIN) 500 MG tablet 1-2 tabs every 6 hours prn muscle spasms 03/25/19   Letitia Neri L, PA-C  naproxen (NAPROSYN) 500 MG tablet Take 1 tablet (500 mg total) by mouth 2 (two) times daily with a meal. 03/25/19   Johnn Hai, PA-C    Family History Family History  Problem Relation Age of Onset  . Diabetes Mother   . Hypertension Father   . Diabetes Father     Social History Social History   Tobacco Use  . Smoking status: Former Research scientist (life sciences)  . Smokeless tobacco: Never Used  Substance Use Topics  . Alcohol use: Not Currently  . Drug use: Yes    Types: Marijuana     Allergies   Patient has no known allergies.   Review of Systems Review of Systems   Physical Exam Triage Vital Signs ED Triage Vitals  Enc Vitals Group     BP 08/02/19 1117 (!) 134/95     Pulse Rate 08/02/19 1117 68     Resp 08/02/19 1117 18     Temp 08/02/19 1117 98.2 F (36.8 C)     Temp Source 08/02/19 1117 Oral     SpO2 08/02/19 1117 100 %     Weight 08/02/19 1112 270 lb (122.5 kg)     Height 08/02/19 1112 6' (1.829 m)     Head Circumference --      Peak Flow --      Pain Score 08/02/19 1112 0     Pain Loc --      Pain Edu? --      Excl.  in [place]? --    No data found.  Updated Vital Signs BP (!) 134/95 (BP Location: Right Arm)   Pulse 68   Temp 98.2 F (36.8 C) (Oral)   Resp 18   Ht 6' (1.829 m)   Wt 122.5 kg   SpO2 100%   BMI 36.62 kg/m   Visual Acuity Right Eye Distance: 20/20(uncorrected) Left Eye Distance: 20/20(uncorrected) Bilateral Distance: 20/20(uncorrected)  Right Eye Near:   Left Eye Near:    Bilateral Near:     Physical Exam Vitals and nursing note reviewed.  Constitutional:      General: He is not in acute distress.    Appearance: He is not toxic-appearing or diaphoretic.  Eyes:     General: Vision grossly intact.        Left eye: Hordeolum (upper eyelid) present.    Extraocular Movements: Extraocular movements intact.     Conjunctiva/sclera: Conjunctivae normal.     Pupils: Pupils are equal,  round, and reactive to light.  Neurological:     Mental Status: He is alert.      UC Treatments / Results  Labs (all labs ordered are listed, but only abnormal results are displayed) Labs Reviewed - No data to display  EKG   Radiology No results found.  Procedures Procedures (including critical care time)  Medications Ordered in UC Medications - No data to display  Initial Impression / Assessment and Plan / UC Course  I have reviewed the triage vital signs and the nursing notes.  Pertinent labs & imaging results that were available during my care of the patient were reviewed by me and considered in my medical decision making (see chart for details).      Final Clinical Impressions(s) / UC Diagnoses   Final diagnoses:  Hordeolum externum of left upper eyelid     Discharge Instructions     Warm moist compresses for 10-15 minutes every 2-4 hours     ED Prescriptions    None     1. diagnosis reviewed with patient 2.. Recommend supportive treatment as above 3. Follow-up prn if symptoms worsen or don't improve   PDMP not reviewed this encounter.   Payton Mccallum,  MD 08/04/19 1606

## 2019-09-05 ENCOUNTER — Other Ambulatory Visit: Payer: Self-pay

## 2019-09-05 ENCOUNTER — Ambulatory Visit
Admission: EM | Admit: 2019-09-05 | Discharge: 2019-09-05 | Disposition: A | Discharge status: Home / Self Care | Payer: Self-pay | Attending: Family Medicine | Admitting: Family Medicine

## 2019-09-05 ENCOUNTER — Encounter: Payer: Self-pay | Admitting: Emergency Medicine

## 2019-09-05 DIAGNOSIS — S29012A Strain of muscle and tendon of back wall of thorax, initial encounter: Secondary | ICD-10-CM

## 2019-09-05 DIAGNOSIS — M549 Dorsalgia, unspecified: Secondary | ICD-10-CM

## 2019-09-05 MED ORDER — METHOCARBAMOL 500 MG PO TABS: 500.0000 mg | ORAL_TABLET | Freq: Two times a day (BID) | ORAL | 0 refills | Status: DC

## 2019-09-05 MED ORDER — MELOXICAM 7.5 MG PO TABS: 7.5000 mg | ORAL_TABLET | Freq: Two times a day (BID) | ORAL | 0 refills | Status: DC

## 2019-09-05 NOTE — Discharge Instructions (Signed)
-  Meloxicam: 1 tablet twice a day -Robaxin 1 tablet twice a day as needed for muscle spasms -Heat and cold therapy as attached -Rehab exercises as attached -Return to clinic or follow-up with primary care provider if symptoms worsen or do not improve

## 2019-09-05 NOTE — ED Provider Notes (Signed)
[facility]    CSN: [id number] Arrival date & time: 09/05/19  1022      History   Chief Complaint Chief Complaint  Patient presents with  . Back Pain    HPI Edward Burns is a 34 y.o. male.   Pt is a 34 yo male who presents with complaint of mid-upper back pain that started yesterday.  Patient states he was reaching above his head to get a storage tote off a shelf.  Patient states the pain was initially very intense and unable to move either direction caused him to breathe a bit heavier.  He states the pain gradually improved.  Patient states he is taken some Tylenol does help a little bit.  Patient states when he sits it not facing forward pain is okay but when he turns to the right or left or lifts his arms up he gets that pain again mid upper back on both sides.  Patient denies any numbness or tingling.  Patient denies any medication allergies.     Past Medical History:  Diagnosis Date  . Gout     There are no problems to display for this patient.   Past Surgical History:  Procedure Laterality Date  . NO PAST SURGERIES         Home Medications    Prior to Admission medications   Medication Sig Start Date End Date Taking? Authorizing Provider  meloxicam (MOBIC) 7.5 MG tablet Take 1 tablet (7.5 mg total) by mouth 2 (two) times daily. 09/05/19   Candis Schatz, PA-C  methocarbamol (ROBAXIN) 500 MG tablet Take 1 tablet (500 mg total) by mouth 2 (two) times daily. 09/05/19   Candis Schatz, PA-C    Family History Family History  Problem Relation Age of Onset  . Diabetes Mother   . Hypertension Father   . Diabetes Father     Social History Social History   Tobacco Use  . Smoking status: Never Smoker  . Smokeless tobacco: Never Used  Substance Use Topics  . Alcohol use: Not Currently  . Drug use: Yes    Frequency: 3.0 times per week    Types: Marijuana     Allergies   Patient has no known allergies.   Review of Systems  Review of Systems as noted above in HPI.  Other systems reviewed and found to be negative.   Physical Exam Triage Vital Signs ED Triage Vitals  Enc Vitals Group     BP 09/05/19 1033 137/90     Pulse Rate 09/05/19 1033 73     Resp 09/05/19 1033 18     Temp 09/05/19 1033 98.6 F (37 C)     Temp Source 09/05/19 1033 Oral     SpO2 09/05/19 1033 99 %     Weight 09/05/19 1034 265 lb (120.2 kg)     Height 09/05/19 1034 6' (1.829 m)     Head Circumference --      Peak Flow --      Pain Score 09/05/19 1032 5     Pain Loc --      Pain Edu? --      Excl. in GC? --    No data found.  Updated Vital Signs BP 137/90 (BP Location: Left Arm)   Pulse 73   Temp 98.6 F (37 C) (Oral)   Resp 18   Ht 6' (1.829 m)   Wt 265 lb (120.2 kg)   SpO2 99%   BMI 35.94 kg/m  Physical Exam Constitutional:      Appearance: Normal appearance. He is not ill-appearing or toxic-appearing.  Cardiovascular:     Rate and Rhythm: Normal rate.  Pulmonary:     Effort: Pulmonary effort is normal.     Breath sounds: Normal breath sounds.  Musculoskeletal:     Cervical back: Normal.     Thoracic back: Spasms and tenderness present.       Back:     Comments: Mid back pain increases with rotation to both left and right, decreased range of motion due to pain.  No pain with moving forward if arms held the side but if allowed to drop down patient experiences pain in the same place.  Tenderness upon palpation.  Neurological:     General: No focal deficit present.     Mental Status: He is alert and oriented to person, place, and time.     Cranial Nerves: No cranial nerve deficit.      UC Treatments / Results  Labs (all labs ordered are listed, but only abnormal results are displayed) Labs Reviewed - No data to display  EKG   Radiology No results found.  Procedures Procedures (including critical care time)  Medications Ordered in UC Medications - No data to display  Initial Impression /  Assessment and Plan / UC Course  I have reviewed the triage vital signs and the nursing notes.  Pertinent labs & imaging results that were available during my care of the patient were reviewed by me and considered in my medical decision making (see chart for details).     Patient with mid back muscular pain.  Noted on palpation and with movement.  Give a prescription for meloxicam for inflammatory process and pain as well as Robaxin for muscle spasm.  Work note given.  Have patient avoid lifting heavy objects or strenuous activity to the back until symptoms improve.  Patient to return to clinic or follow-up primary care provider should his symptoms worsen or not improve.  Final Clinical Impressions(s) / UC Diagnoses   Final diagnoses:  Mid back pain  Strain of mid-back, initial encounter     Discharge Instructions     -Meloxicam: 1 tablet twice a day -Robaxin 1 tablet twice a day as needed for muscle spasms -Heat and cold therapy as attached -Rehab exercises as attached -Return to clinic or follow-up with primary care provider if symptoms worsen or do not improve     ED Prescriptions    Medication Sig Dispense Auth. Provider   methocarbamol (ROBAXIN) 500 MG tablet Take 1 tablet (500 mg total) by mouth 2 (two) times daily. 20 tablet Luvenia Redden, PA-C   meloxicam (MOBIC) 7.5 MG tablet Take 1 tablet (7.5 mg total) by mouth 2 (two) times daily. 60 tablet Luvenia Redden, PA-C     PDMP not reviewed this encounter.   Luvenia Redden, PA-C 09/05/19 1105

## 2019-09-05 NOTE — ED Triage Notes (Signed)
Patient in today c/o back pain in the middle of his back. Patient states he was reaching above his head to lift some totes at work yesterday and started having pain. Patient has taken Tylenol.

## 2019-11-27 ENCOUNTER — Encounter: Payer: Self-pay | Admitting: Emergency Medicine

## 2019-11-27 ENCOUNTER — Other Ambulatory Visit: Payer: Self-pay

## 2019-11-27 ENCOUNTER — Emergency Department
Admission: EM | Admit: 2019-11-27 | Discharge: 2019-11-27 | Disposition: A | Discharge status: Home / Self Care | Payer: Self-pay | Attending: Emergency Medicine | Admitting: Emergency Medicine

## 2019-11-27 DIAGNOSIS — H0014 Chalazion left upper eyelid: Secondary | ICD-10-CM | POA: Insufficient documentation

## 2019-11-27 NOTE — ED Triage Notes (Signed)
Pt here for cyst like area to left eyelid for 5-6 months. Came in today because it is now becoming kind of irritating.  Eye has been water. No vision changes.  Has not seen eye doctor.

## 2019-11-27 NOTE — Discharge Instructions (Addendum)
Due to the long-term presentation of this lesion and refractory to conservative treatment advised follow-up with ophthalmology for definitive evaluation and treatment.

## 2019-11-27 NOTE — ED Provider Notes (Signed)
[facility] Emergency Department Provider Note   ____________________________________________   First MD Initiated Contact with Patient 11/27/19 1008     (approximate)  I have reviewed the triage vital signs and the nursing notes.   HISTORY  Chief Complaint Eye Problem    HPI Edward Burns is a 34 y.o. male patient presents with papular lesion left upper eyelid for 5-6 months.  Patient states he has been to 2 urgent care visits and each time was prescribed antibiotic eyedrops and advised apply warm compresses to area.  Patient states lesion has increased in size in the past 2 days.  Patient also has satellite lesions to the right upper eyelid.  Patient denies pain or vision disturbance.         Past Medical History:  Diagnosis Date  . Gout     There are no problems to display for this patient.   Past Surgical History:  Procedure Laterality Date  . NO PAST SURGERIES      Prior to Admission medications   Medication Sig Start Date End Date Taking? Authorizing Provider  meloxicam (MOBIC) 7.5 MG tablet Take 1 tablet (7.5 mg total) by mouth 2 (two) times daily. 09/05/19   Candis Schatz, PA-C  methocarbamol (ROBAXIN) 500 MG tablet Take 1 tablet (500 mg total) by mouth 2 (two) times daily. 09/05/19   Candis Schatz, PA-C    Allergies Patient has no known allergies.  Family History  Problem Relation Age of Onset  . Diabetes Mother   . Hypertension Father   . Diabetes Father     Social History Social History   Tobacco Use  . Smoking status: Never Smoker  . Smokeless tobacco: Never Used  Substance Use Topics  . Alcohol use: Not Currently  . Drug use: Yes    Frequency: 3.0 times per week    Types: Marijuana    Review of Systems Constitutional: No fever/chills Eyes: No visual changes.  Left upper eyelid lesion. ENT: No sore throat. Cardiovascular: Denies chest pain. Respiratory: Denies shortness of breath.  Gastrointestinal: No abdominal pain.  No nausea, no vomiting.  No diarrhea.  No constipation. Genitourinary: Negative for dysuria. Musculoskeletal: Negative for back pain. Skin: Negative for rash. Neurological: Negative for headaches, focal weakness or numbness.   ____________________________________________   PHYSICAL EXAM:  VITAL SIGNS: ED Triage Vitals  Enc Vitals Group     BP 11/27/19 1010 132/72     Pulse Rate 11/27/19 1010 60     Resp 11/27/19 1010 16     Temp 11/27/19 1010 97.9 F (36.6 C)     Temp Source 11/27/19 1010 Oral     SpO2 11/27/19 1010 99 %     Weight 11/27/19 1004 265 lb (120.2 kg)     Height 11/27/19 1004 6' (1.829 m)     Head Circumference --      Peak Flow --      Pain Score 11/27/19 1004 0     Pain Loc --      Pain Edu? --      Excl. in GC? --    Constitutional: Alert and oriented. Well appearing and in no acute distress. Eyes: Conjunctivae are normal. PERRL. EOMI. left upper eyelid papular lesion. Cardiovascular: Normal rate, regular rhythm. Grossly normal heart sounds.  Good peripheral circulation. Respiratory: Normal respiratory effort.  No retractions. Lungs CTAB. Skin:  Skin is warm, dry and intact.  Erythematous edematous papular lesion left upper eyelid. Psychiatric: Mood and affect  are normal. Speech and behavior are normal.  ____________________________________________   LABS (all labs ordered are listed, but only abnormal results are displayed)  Labs Reviewed - No data to display ____________________________________________  EKG   ____________________________________________  RADIOLOGY  ED MD interpretation:    Official radiology report(s): No results found.  ____________________________________________   PROCEDURES  Procedure(s) performed (including Critical Care):  Procedures   ____________________________________________   INITIAL IMPRESSION / ASSESSMENT AND PLAN / ED COURSE  As part of my medical decision  making, I reviewed the following data within the [facility]     Patient presents with painless left upper eyelid lesion for 5 to 6 months.  Lesions refractory to antibiotic eyedrops and and conservative measures consist of warm compresses to area.  Discussed with patient due to the long-term presentation advisable to follow ophthalmology for definitive evaluation to consider incision and drainage of lesion.    Edward Burns was evaluated in Emergency Department on 11/27/2019 for the symptoms described in the history of present illness. He was evaluated in the context of the global COVID-19 pandemic, which necessitated consideration that the patient might be at risk for infection with the SARS-CoV-2 virus that causes COVID-19. Institutional protocols and algorithms that pertain to the evaluation of patients at risk for COVID-19 are in a state of rapid change based on information released by regulatory bodies including the CDC and federal and state organizations. These policies and algorithms were followed during the patient's care in the ED.       ____________________________________________   FINAL CLINICAL IMPRESSION(S) / ED DIAGNOSES  Final diagnoses:  Chalazion left upper eyelid     ED Discharge Orders    None       Note:  This document was prepared using Dragon voice recognition software and may include unintentional dictation errors.    Sable Feil, PA-C 11/27/19 1107    Earleen Newport, MD 11/27/19 1114

## 2019-12-19 ENCOUNTER — Emergency Department
Admission: EM | Admit: 2019-12-19 | Discharge: 2019-12-19 | Disposition: A | Discharge status: Home / Self Care | Payer: Self-pay | Attending: Emergency Medicine | Admitting: Emergency Medicine

## 2019-12-19 ENCOUNTER — Ambulatory Visit
Admission: RE | Admit: 2019-12-19 | Discharge: 2019-12-19 | Disposition: A | Discharge status: Home / Self Care | Payer: Self-pay | Source: Ambulatory Visit | Attending: Emergency Medicine | Admitting: Emergency Medicine

## 2019-12-19 ENCOUNTER — Other Ambulatory Visit: Payer: Self-pay | Admitting: Emergency Medicine

## 2019-12-19 DIAGNOSIS — R52 Pain, unspecified: Secondary | ICD-10-CM | POA: Insufficient documentation

## 2019-12-19 DIAGNOSIS — Z79899 Other long term (current) drug therapy: Secondary | ICD-10-CM | POA: Insufficient documentation

## 2019-12-19 DIAGNOSIS — M545 Low back pain, unspecified: Secondary | ICD-10-CM

## 2019-12-19 MED ORDER — HYDROMORPHONE HCL 1 MG/ML IJ SOLN
1.0000 mg | Freq: Once | INTRAMUSCULAR | Status: AC
  Administered 2019-12-19: 1 mg via INTRAMUSCULAR
  Filled 2019-12-19: qty 1

## 2019-12-19 MED ORDER — HYDROCODONE-ACETAMINOPHEN 5-325 MG PO TABS: 1.0000 | ORAL_TABLET | Freq: Four times a day (QID) | ORAL | 0 refills | Status: AC | PRN

## 2019-12-19 MED ORDER — IBUPROFEN 600 MG PO TABS: 600.0000 mg | ORAL_TABLET | Freq: Three times a day (TID) | ORAL | 0 refills | Status: DC | PRN

## 2019-12-19 MED ORDER — PREDNISONE 20 MG PO TABS: 40.0000 mg | ORAL_TABLET | Freq: Every day | ORAL | 0 refills | Status: AC

## 2019-12-19 MED ORDER — ONDANSETRON 4 MG PO TBDP
4.0000 mg | ORAL_TABLET | Freq: Once | ORAL | Status: AC
  Administered 2019-12-19: 4 mg via ORAL
  Filled 2019-12-19: qty 1

## 2019-12-19 MED ORDER — KETOROLAC TROMETHAMINE 60 MG/2ML IM SOLN
60.0000 mg | Freq: Once | INTRAMUSCULAR | Status: AC
  Administered 2019-12-19: 60 mg via INTRAMUSCULAR
  Filled 2019-12-19: qty 2

## 2019-12-19 NOTE — ED Provider Notes (Signed)
[facility] Emergency Department Provider Note  ____________________________________________   First MD Initiated Contact with Patient 12/19/19 1853     (approximate)  I have reviewed the triage vital signs and the nursing notes.   HISTORY  Chief Complaint No chief complaint on file.    HPI Edward Burns is a 34 y.o. male  Here with back pain. Pt states that earlier today, he was going to the bathroom when he stood up and felt something pull in his back. He felt acute onset of severe midline and paraspinal lower lumbar pain, which shot down his bilateral legs. He states he "couldn't move" though he was able to walk back to his bed, and it sounds like this was more so due to severe pain with any movement. He denies any preceding illness or injury, but he does lift/twist often at work. No prior injuries. No loss of bowel or bladder continence. No abdominal pain, n/v. No LE weakness, numbness, or paresthesias. No h/o IVDU. Sx worse with any movement, palpation. No alleviating factors.       Past Medical History:  Diagnosis Date  . Gout     There are no problems to display for this patient.   Past Surgical History:  Procedure Laterality Date  . NO PAST SURGERIES      Prior to Admission medications   Medication Sig Start Date End Date Taking? Authorizing Provider  HYDROcodone-acetaminophen (NORCO/VICODIN) 5-325 MG tablet Take 1-2 tablets by mouth every 6 (six) hours as needed for moderate pain or severe pain. 12/19/19 12/18/20  Duffy Bruce, MD  ibuprofen (ADVIL) 600 MG tablet Take 1 tablet (600 mg total) by mouth every 8 (eight) hours as needed for moderate pain. 12/19/19   Duffy Bruce, MD  meloxicam (MOBIC) 7.5 MG tablet Take 1 tablet (7.5 mg total) by mouth 2 (two) times daily. 09/05/19   Luvenia Redden, PA-C  methocarbamol (ROBAXIN) 500 MG tablet Take 1 tablet (500 mg total) by mouth 2 (two) times daily. 09/05/19   Luvenia Redden, PA-C    predniSONE (DELTASONE) 20 MG tablet Take 2 tablets (40 mg total) by mouth daily for 7 days. 12/19/19 12/26/19  Duffy Bruce, MD    Allergies Patient has no known allergies.  Family History  Problem Relation Age of Onset  . Diabetes Mother   . Hypertension Father   . Diabetes Father     Social History Social History   Tobacco Use  . Smoking status: Never Smoker  . Smokeless tobacco: Never Used  Substance Use Topics  . Alcohol use: Not Currently  . Drug use: Yes    Frequency: 3.0 times per week    Types: Marijuana    Review of Systems  Review of Systems  Constitutional: Negative for chills and fever.  HENT: Negative for sore throat.   Respiratory: Negative for shortness of breath.   Cardiovascular: Negative for chest pain.  Gastrointestinal: Negative for abdominal pain.  Genitourinary: Negative for flank pain.  Musculoskeletal: Positive for back pain and gait problem. Negative for neck pain.  Skin: Negative for rash and wound.  Allergic/Immunologic: Negative for immunocompromised state.  Neurological: Negative for weakness and numbness.  Hematological: Does not bruise/bleed easily.     ____________________________________________  PHYSICAL EXAM:      VITAL SIGNS: ED Triage Vitals [12/19/19 1500]  Enc Vitals Group     BP 109/88     Pulse      Resp 20     Temp 98.2 F (  36.8 C)     Temp Source Oral     SpO2 100 %     Weight      Height      Head Circumference      Peak Flow      Pain Score      Pain Loc      Pain Edu?      Excl. in GC?      Physical Exam Vitals and nursing note reviewed.  Constitutional:      General: He is not in acute distress.    Appearance: He is well-developed.  HENT:     Head: Normocephalic and atraumatic.  Eyes:     Conjunctiva/sclera: Conjunctivae normal.  Cardiovascular:     Rate and Rhythm: Normal rate and regular rhythm.     Heart sounds: Normal heart sounds.  Pulmonary:     Effort: Pulmonary effort is normal. No  respiratory distress.     Breath sounds: No wheezing.  Abdominal:     General: There is no distension.  Musculoskeletal:     Cervical back: Neck supple.  Skin:    General: Skin is warm.     Capillary Refill: Capillary refill takes less than 2 seconds.     Findings: No rash.  Neurological:     Mental Status: He is alert and oriented to person, place, and time.     Motor: No abnormal muscle tone.      Spine Exam: Inspection/Palpation: Moderate TTP throughout midline lower lumbar spine, particularly mid lumbar spine.  Strength: 5/5 throughout LE bilaterally (hip flexion/extension, adduction/abduction; knee flexion/extension; foot dorsiflexion/plantarflexion, inversion/eversion; great toe inversion) Sensation: Intact to light touch in proximal and distal LE bilaterally Reflexes: 2+ quadriceps and achilles reflexes  ____________________________________________   LABS (all labs ordered are listed, but only abnormal results are displayed)  Labs Reviewed - No data to display  ____________________________________________  EKG: None ________________________________________  RADIOLOGY All imaging, including plain films, CT scans, and ultrasounds, independently reviewed by me, and interpretations confirmed via formal radiology reads.  ED MD interpretation:   XR Spine: Negative  Official radiology report(s): DG Lumbar Spine 2-3 Views  Result Date: 12/19/2019 CLINICAL DATA:  Acute low back pain. EXAM: LUMBAR SPINE - 2-3 VIEW COMPARISON:  Dec 11, 2005. FINDINGS: No fracture or spondylolisthesis is noted. Disc spaces are well-maintained. Patient appears to be tilted in position to the right. IMPRESSION: No significant abnormality seen in the lumbar spine. Electronically Signed   By: Lupita Raider M.D.   On: 12/19/2019 19:32    ____________________________________________  PROCEDURES   Procedure(s) performed (including Critical  Care):  Procedures  ____________________________________________  INITIAL IMPRESSION / MDM / ASSESSMENT AND PLAN / ED COURSE  As part of my medical decision making, I reviewed the following data within the electronic MEDICAL RECORD NUMBER Nursing notes reviewed and incorporated, Old chart reviewed, Notes from prior ED visits, and [place] Controlled Substance Database       *Edward Burns was evaluated in Emergency Department on 12/19/2019 for the symptoms described in the history of present illness. He was evaluated in the context of the global COVID-19 pandemic, which necessitated consideration that the patient might be at risk for infection with the SARS-CoV-2 virus that causes COVID-19. Institutional protocols and algorithms that pertain to the evaluation of patients at risk for COVID-19 are in a state of rapid change based on information released by regulatory bodies including the CDC and federal and state organizations. These policies and algorithms were  followed during the patient's care in the ED.  Some ED evaluations and interventions may be delayed as a result of limited staffing during the pandemic.*     Medical Decision Making:  34 yo M here with severe lower back pain. Suspect likely acute disc herniation vs back strain. No preceding trauma. Plain films are unremarkable. He has no LE weakness, numbness, loss of reflexes, or s/s to suggest cauda equina or acute cord compression. No fever, chills, IVDU, or risk factors for infectious etiology such as epidural abscess or osteo. He feels somewhat better after IM meds here and is able to ambulate in the ED. Will treat with steroids, analgesics and advised gentle stretching, movement, and outpatient follow-up.  ____________________________________________  FINAL CLINICAL IMPRESSION(S) / ED DIAGNOSES  Final diagnoses:  Acute midline low back pain without sciatica     MEDICATIONS GIVEN DURING THIS VISIT:  Medications  HYDROmorphone  (DILAUDID) injection 1 mg (1 mg Intramuscular Given 12/19/19 1931)  ketorolac (TORADOL) injection 60 mg (60 mg Intramuscular Given 12/19/19 1932)  ondansetron (ZOFRAN-ODT) disintegrating tablet 4 mg (4 mg Oral Given 12/19/19 1931)     ED Discharge Orders         Ordered    predniSONE (DELTASONE) 20 MG tablet  Daily     12/19/19 2010    ibuprofen (ADVIL) 600 MG tablet  Every 8 hours PRN     12/19/19 2010    HYDROcodone-acetaminophen (NORCO/VICODIN) 5-325 MG tablet  Every 6 hours PRN     12/19/19 2010           Note:  This document was prepared using Dragon voice recognition software and may include unintentional dictation errors.   Shaune Pollack, MD 12/19/19 2234

## 2019-12-19 NOTE — ED Notes (Signed)
Pt was on the toilet and tried to get up and felt/heard something pop in his back.

## 2021-03-05 ENCOUNTER — Emergency Department: Payer: Commercial Indemnity

## 2021-03-05 ENCOUNTER — Emergency Department
Admission: EM | Admit: 2021-03-05 | Discharge: 2021-03-05 | Disposition: A | Discharge status: Home / Self Care | Payer: Commercial Indemnity | Attending: Emergency Medicine | Admitting: Emergency Medicine

## 2021-03-05 ENCOUNTER — Encounter: Payer: Self-pay | Admitting: Emergency Medicine

## 2021-03-05 ENCOUNTER — Other Ambulatory Visit: Payer: Self-pay

## 2021-03-05 DIAGNOSIS — X500XXA Overexertion from strenuous movement or load, initial encounter: Secondary | ICD-10-CM | POA: Insufficient documentation

## 2021-03-05 DIAGNOSIS — S34109A Unspecified injury to unspecified level of lumbar spinal cord, initial encounter: Secondary | ICD-10-CM | POA: Diagnosis present

## 2021-03-05 DIAGNOSIS — Y990 Civilian activity done for income or pay: Secondary | ICD-10-CM | POA: Insufficient documentation

## 2021-03-05 DIAGNOSIS — S39012A Strain of muscle, fascia and tendon of lower back, initial encounter: Secondary | ICD-10-CM | POA: Diagnosis not present

## 2021-03-05 DIAGNOSIS — M62830 Muscle spasm of back: Secondary | ICD-10-CM | POA: Diagnosis not present

## 2021-03-05 MED ORDER — MELOXICAM 15 MG PO TABS: 15.0000 mg | ORAL_TABLET | Freq: Every day | ORAL | 2 refills | Status: DC

## 2021-03-05 MED ORDER — IBUPROFEN 800 MG PO TABS
800.0000 mg | ORAL_TABLET | Freq: Once | ORAL | Status: AC
  Administered 2021-03-05: 800 mg via ORAL
  Filled 2021-03-05: qty 1

## 2021-03-05 MED ORDER — BACLOFEN 10 MG PO TABS: 10.0000 mg | ORAL_TABLET | Freq: Three times a day (TID) | ORAL | 0 refills | Status: AC

## 2021-03-05 MED ORDER — CYCLOBENZAPRINE HCL 10 MG PO TABS
10.0000 mg | ORAL_TABLET | Freq: Once | ORAL | Status: AC
  Administered 2021-03-05: 10 mg via ORAL
  Filled 2021-03-05: qty 1

## 2021-03-05 NOTE — ED Notes (Signed)
See triage note  Presents with lower back pain  States he lifted a tote on weds   Felt a pop in his back   On Thursday noticed some swelling to left lower back  Ambulates well

## 2021-03-05 NOTE — Discharge Instructions (Addendum)
Follow-up with orthopedics if not improving in 1 week.  Use medication as prescribed.  Apply ice to the lower back.  Do mild stretches to help loosen the muscles in your back.  Massage therapy may help.  Or he could see a chiropractor. Return to the emergency department if you are worsening

## 2021-03-05 NOTE — ED Triage Notes (Signed)
Pt comes into the ED via POV c/o low left back pain after picking up a tote at work.  Pt presents with good gait and in NAD.  Pt denies any urinary problems. Pt filling as workers comp.

## 2021-03-05 NOTE — ED Provider Notes (Signed)
[facility] Emergency Department Provider Note  ____________________________________________   Event Date/Time   First MD Initiated Contact with Patient 03/05/21 [phone]     (approximate)  I have reviewed the triage vital signs and the nursing notes.   HISTORY  Chief Complaint Back Pain    HPI Edward Burns is a 35 y.o. male  C/o low back pain for 2 day, known injury, pain started after lifting a tote at work, pain is worse with movement, increased with bending over, denies numbness, tingling, or changes in bowel/urinary habits,  Using otc meds without relief, patient is concerned as he is leaning to the side, states the entire back looks out of alignment Remainder ros neg   Past Medical History:  Diagnosis Date   Gout     There are no problems to display for this patient.   Past Surgical History:  Procedure Laterality Date   NO PAST SURGERIES      Prior to Admission medications   Medication Sig Start Date End Date Taking? Authorizing Provider  baclofen (LIORESAL) 10 MG tablet Take 1 tablet (10 mg total) by mouth 3 (three) times daily for 7 days. 03/05/21 03/12/21 Yes Toiya Morrish, Roselyn Bering, PA-C  meloxicam (MOBIC) 15 MG tablet Take 1 tablet (15 mg total) by mouth daily. 03/05/21 03/05/22 Yes Timberlynn Kizziah, Roselyn Bering, PA-C    Allergies Patient has no known allergies.  Family History  Problem Relation Age of Onset   Diabetes Mother    Hypertension Father    Diabetes Father     Social History Social History   Tobacco Use   Smoking status: Never   Smokeless tobacco: Never  Vaping Use   Vaping Use: Never used  Substance Use Topics   Alcohol use: Not Currently   Drug use: Yes    Frequency: 3.0 times per week    Types: Marijuana    Review of Systems  Constitutional: No fever/chills Eyes: No visual changes. ENT: No sore throat. Respiratory: Denies cough Genitourinary: Negative for dysuria. Musculoskeletal: Positive for back pain. Skin:  Negative for rash.    ____________________________________________   PHYSICAL EXAM:  VITAL SIGNS: ED Triage Vitals  Enc Vitals Group     BP 03/05/21 0751 127/84     Pulse Rate 03/05/21 0751 85     Resp 03/05/21 0751 18     Temp 03/05/21 0751 98.7 F (37.1 C)     Temp Source 03/05/21 0751 Oral     SpO2 03/05/21 0751 100 %     Weight 03/05/21 0747 264 lb 15.9 oz (120.2 kg)     Height 03/05/21 0747 6' (1.829 m)     Head Circumference --      Peak Flow --      Pain Score 03/05/21 0747 8     Pain Loc --      Pain Edu? --      Excl. in GC? --     Constitutional: Alert and oriented. Well appearing and in no acute distress. Eyes: Conjunctivae are normal.  Head: Atraumatic. Nose: No congestion/rhinnorhea. Mouth/Throat: Mucous membranes are moist.   Neck:  supple no lymphadenopathy noted Cardiovascular: Normal rate, regular rhythm. Heart sounds are normal Respiratory: Normal respiratory effort.  No retractions, lungs c t a  Abd: soft nontender bs normal all 4 quad GU: deferred Musculoskeletal: FROM all extremities, warm and well perfused.  Decreased rom of back due to discomfort, lumbar spine mildly tender, patient is leaning to the side, negative slr, 5/5  strength in great toes b/l, 5/5 strength in lower legs, n/v intact Neurologic:  Normal speech and language.  Skin:  Skin is warm, dry and intact. No rash noted. Psychiatric: Mood and affect are normal. Speech and behavior are normal.  ____________________________________________   LABS (all labs ordered are listed, but only abnormal results are displayed)  Labs Reviewed - No data to display ____________________________________________   ____________________________________________  RADIOLOGY  Lumbar spine  ____________________________________________   PROCEDURES  Procedure(s) performed: No  Procedures    ____________________________________________   INITIAL IMPRESSION / ASSESSMENT AND PLAN / ED  COURSE  Pertinent labs & imaging results that were available during my care of the patient were reviewed by me and considered in my medical decision making (see chart for details).   The patient is a 35 year old male presents emergency department with concerns of low back pain.  See HPI.  Physical exam shows patient appears stable  X-ray lumbar spine  X-ray lumbar spine reviewed by me confirmed by radiology to be negative.  Patient was given ibuprofen and Flexeril while here in the ED.  Prescription for meloxicam and baclofen.  He is to follow-up with his regular doctor or orthopedics if not improving in 1 week.  Back exercises were given for patient to do gentle stretches.  He is to apply ice.  Return emergency department worsening.  Work note and restrictions were provided.     As part of my medical decision making, I reviewed the following data within the electronic MEDICAL RECORD NUMBER Nursing notes reviewed and incorporated, Old chart reviewed, Radiograph reviewed , Notes from prior ED visits, and [place] Controlled Substance Database  ____________________________________________   FINAL CLINICAL IMPRESSION(S) / ED DIAGNOSES  Final diagnoses:  Muscle spasm of back  Strain of lumbar region, initial encounter      NEW MEDICATIONS STARTED DURING THIS VISIT:  Discharge Medication List as of 03/05/2021 10:24 AM     START taking these medications   Details  baclofen (LIORESAL) 10 MG tablet Take 1 tablet (10 mg total) by mouth 3 (three) times daily for 7 days., Starting Fri 03/05/2021, Until Fri 03/12/2021, Normal    meloxicam (MOBIC) 15 MG tablet Take 1 tablet (15 mg total) by mouth daily., Starting Fri 03/05/2021, Until Sat 03/05/2022, Normal         Note:  This document was prepared using Dragon voice recognition software and may include unintentional dictation errors.     Faythe Ghee, PA-C 03/05/21 1211    Georga Hacking, MD 03/05/21 [phone]

## 2021-09-11 ENCOUNTER — Emergency Department: Payer: BC Managed Care – PPO

## 2021-09-11 ENCOUNTER — Emergency Department
Admission: EM | Admit: 2021-09-11 | Discharge: 2021-09-11 | Disposition: A | Discharge status: Home / Self Care | Payer: BC Managed Care – PPO | Attending: Emergency Medicine | Admitting: Emergency Medicine

## 2021-09-11 ENCOUNTER — Encounter: Payer: Self-pay | Admitting: Emergency Medicine

## 2021-09-11 ENCOUNTER — Other Ambulatory Visit: Payer: Self-pay

## 2021-09-11 DIAGNOSIS — M542 Cervicalgia: Secondary | ICD-10-CM | POA: Diagnosis not present

## 2021-09-11 DIAGNOSIS — F418 Other specified anxiety disorders: Secondary | ICD-10-CM | POA: Diagnosis not present

## 2021-09-11 DIAGNOSIS — M549 Dorsalgia, unspecified: Secondary | ICD-10-CM | POA: Insufficient documentation

## 2021-09-11 DIAGNOSIS — Z20822 Contact with and (suspected) exposure to covid-19: Secondary | ICD-10-CM | POA: Diagnosis not present

## 2021-09-11 DIAGNOSIS — B349 Viral infection, unspecified: Secondary | ICD-10-CM

## 2021-09-11 DIAGNOSIS — R059 Cough, unspecified: Secondary | ICD-10-CM | POA: Diagnosis present

## 2021-09-11 LAB — RESP PANEL BY RT-PCR (FLU A&B, COVID) ARPGX2
Influenza A by PCR: NEGATIVE
Influenza B by PCR: NEGATIVE
SARS Coronavirus 2 by RT PCR: NEGATIVE

## 2021-09-11 MED ORDER — HYDROXYZINE PAMOATE 25 MG PO CAPS: 25.0000 mg | ORAL_CAPSULE | Freq: Three times a day (TID) | ORAL | 0 refills | Status: DC | PRN

## 2021-09-11 NOTE — ED Notes (Signed)
Pt to ED for several URI-related complaints.  Since about 2 weeks, has pain in neck when turns head, pain in chest (central mid area) when twists body or lays on side or back, chills and fever (afebrile).  Was coughing some 2 weeks ago but not now. Coughing brought on pain in chest, which he describes as pressure (since 2 weeks).   Pt also has generalized body aches and HA.  States has been slightly nauseous and has not eaten well in 2 days.

## 2021-09-11 NOTE — ED Provider Notes (Signed)
[facility] Provider Note    Event Date/Time   First MD Initiated Contact with Patient 09/11/21 [phone]     (approximate)   History   Chills and Back Pain   HPI  Edward Burns is a 36 y.o. male with history of gout and as listed in EMR presents to the emergency department for treatment and evaluation of cough, chills, neck and back pain. Symptoms started about 2 weeks ago. Developed nausea 2 days ago and hasn't had an appetite. No known fever.   He has also had some intermittent shortness of breath and palpitations, especially when at work. None currently or for the last few days.      Physical Exam   Triage Vital Signs: ED Triage Vitals  Enc Vitals Group     BP 09/11/21 0831 (!) 144/115     Pulse Rate 09/11/21 0831 68     Resp 09/11/21 0831 18     Temp 09/11/21 0831 98 F (36.7 C)     Temp Source 09/11/21 0831 Oral     SpO2 09/11/21 0831 99 %     Weight 09/11/21 0827 290 lb (131.5 kg)     Height 09/11/21 0827 6\' 1"  (1.854 m)     Head Circumference --      Peak Flow --      Pain Score 09/11/21 0827 7     Pain Loc --      Pain Edu? --      Excl. in [place]? --     Most recent vital signs: Vitals:   09/11/21 0831 09/11/21 1009  BP: (!) 144/115 (!) 119/98  Pulse: 68 82  Resp: 18 16  Temp: 98 F (36.7 C)   SpO2: 99% 99%    General: Awake, no distress.  CV:  Good peripheral perfusion.  Resp:  Normal effort. Breath sounds clear. Abd:  No distention.  Other:     ED Results / Procedures / Treatments   Labs (all labs ordered are listed, but only abnormal results are displayed) Labs Reviewed  RESP PANEL BY RT-PCR (FLU A&B, COVID) [id number]     EKG  ED ECG REPORT I, Qasim Diveley, FNP-BC personally viewed and interpreted this ECG.   Date: 09/11/2021  EKG Time: 0904  Rate: 71  Rhythm: normal EKG, normal sinus rhythm  Axis: normal  Intervals:none  ST&T Change: no ST change.    RADIOLOGY  Image and radiology report  reviewed by me.  Chest x-ray negative for acute concerns.  PROCEDURES:  Critical Care performed: No  Procedures   MEDICATIONS ORDERED IN ED: Medications - No data to display   IMPRESSION / MDM / [place] / ED COURSE   I have reviewed the triage note.  Differential diagnosis includes, but is not limited to, Covid, flu, URI, pneumonia  36 year old male presenting to the emergency department for treatment and evaluation of multiple clinical complaints.  See HPI for further details.  Work-up is reassuring.  Chest x-ray is negative for acute concerns.  COVID and influenza testing are negative.  EKG is normal as well.  Results discussed with the patient who feels reassured.  After further discussion, the intermittent palpitations and shortness of breath seem to be related to some situational anxiety.  Plan will be to treat him with Vistaril 3 times a day as needed and have him establish a primary care provider.  He is to follow-up when possible for further management.  If symptoms change, worsen, or  he has new concerns before his appointment he is to return to the emergency department.      FINAL CLINICAL IMPRESSION(S) / ED DIAGNOSES   Final diagnoses:  Acute viral syndrome  Situational anxiety     Rx / DC Orders   ED Discharge Orders          Ordered    hydrOXYzine (VISTARIL) 25 MG capsule  3 times daily PRN        09/11/21 1004             Note:  This document was prepared using Dragon voice recognition software and may include unintentional dictation errors.   Victorino Dike, FNP 09/11/21 1011    Harvest Dark, MD 09/11/21 1408

## 2021-09-11 NOTE — ED Notes (Signed)
E sig pad failed. Verbal consent obtained for discharged.

## 2021-09-11 NOTE — ED Triage Notes (Signed)
Pt reports back pain and chills for 2 days. Pt concerned he may have covid or something but denies exposures. Pt unsure of fever but reports chills and sweats. Pt denies urinary sx's, denies cough or congestion.

## 2021-10-30 ENCOUNTER — Other Ambulatory Visit: Payer: Self-pay

## 2021-10-30 ENCOUNTER — Ambulatory Visit
Admission: EM | Admit: 2021-10-30 | Discharge: 2021-10-30 | Disposition: A | Discharge status: Home / Self Care | Payer: BC Managed Care – PPO | Attending: Emergency Medicine | Admitting: Emergency Medicine

## 2021-10-30 ENCOUNTER — Encounter: Payer: Self-pay | Admitting: Emergency Medicine

## 2021-10-30 DIAGNOSIS — K649 Unspecified hemorrhoids: Secondary | ICD-10-CM

## 2021-10-30 DIAGNOSIS — K5900 Constipation, unspecified: Secondary | ICD-10-CM | POA: Diagnosis not present

## 2021-10-30 MED ORDER — HYDROCORTISONE ACETATE 25 MG RE SUPP: 25.0000 mg | Freq: Two times a day (BID) | RECTAL | 0 refills | Status: DC

## 2021-10-30 NOTE — ED Provider Notes (Signed)
?[facility] ? ? ? ?CSN: [id number] ?Arrival date & time: 10/30/21  1228 ? ? ?  ? ?History   ?Chief Complaint ?Chief Complaint  ?Patient presents with  ? Constipation  ? Rectal Pain  ? ? ?HPI ?Edward Burns is a 36 y.o. male.  ? ?HPI ? ?36 year old male here for evaluation of GI complaints. ? ?Patient reports that he has been experiencing rectal pain since yesterday after having a hard bowel movement.  He also noticed some blood when he wiped after the bowel movement yesterday.  He had another bowel movement today which was also hard and then experienced some blood when wiping as well.  He is also complaining of itching in his rectum.  He denies any abdominal pain, nausea, vomiting, or abdominal distention. ? ?Past Medical History:  ?Diagnosis Date  ? Gout   ? ? ?There are no problems to display for this patient. ? ? ?Past Surgical History:  ?Procedure Laterality Date  ? NO PAST SURGERIES    ? WISDOM TOOTH EXTRACTION    ? ? ? ? ? ?Home Medications   ? ?Prior to Admission medications   ?Medication Sig Start Date End Date Taking? Authorizing Provider  ?hydrocortisone (ANUSOL-HC) 25 MG suppository Place 1 suppository (25 mg total) rectally 2 (two) times daily. 10/30/21  Yes Becky Augusta, NP  ? ? ?Family History ?Family History  ?Problem Relation Age of Onset  ? Diabetes Mother   ? Hypertension Father   ? Diabetes Father   ? ? ?Social History ?Social History  ? ?Tobacco Use  ? Smoking status: Never  ? Smokeless tobacco: Never  ?Vaping Use  ? Vaping Use: Never used  ?Substance Use Topics  ? Alcohol use: Not Currently  ? Drug use: Yes  ?  Frequency: 3.0 times per week  ?  Types: Marijuana  ? ? ? ?Allergies   ?Patient has no known allergies. ? ? ?Review of Systems ?Review of Systems  ?Constitutional:  Negative for fever.  ?Gastrointestinal:  Positive for anal bleeding, constipation and rectal pain. Negative for abdominal pain, blood in stool, diarrhea, nausea and vomiting.  ?Skin:  Negative for rash.   ?Hematological: Negative.   ?Psychiatric/Behavioral: Negative.    ? ? ?Physical Exam ?Triage Vital Signs ?ED Triage Vitals  ?Enc Vitals Group  ?   BP --   ?   Pulse --   ?   Resp --   ?   Temp --   ?   Temp src --   ?   SpO2 --   ?   Weight 10/30/21 1249 290 lb (131.5 kg)  ?   Height 10/30/21 1249 6\' 1"  (1.854 m)  ?   Head Circumference --   ?   Peak Flow --   ?   Pain Score 10/30/21 1248 8  ?   Pain Loc --   ?   Pain Edu? --   ?   Excl. in GC? --   ? ?No data found. ? ?Updated Vital Signs ?BP (!) 142/89 (BP Location: Right Arm)   Pulse 67   Temp 98.1 ?F (36.7 ?C) (Oral)   Resp 15   Ht 6\' 1"  (1.854 m)   Wt 290 lb (131.5 kg)   SpO2 98%   BMI 38.26 kg/m?  ? ?Visual Acuity ?Right Eye Distance:   ?Left Eye Distance:   ?Bilateral Distance:   ? ?Right Eye Near:   ?Left Eye Near:    ?Bilateral Near:    ? ?  Physical Exam ?Vitals and nursing note reviewed.  ?Constitutional:   ?   Appearance: Normal appearance. He is not ill-appearing.  ?HENT:  ?   Head: Normocephalic and atraumatic.  ?Cardiovascular:  ?   Rate and Rhythm: Normal rate and regular rhythm.  ?   Pulses: Normal pulses.  ?   Heart sounds: Normal heart sounds. No murmur heard. ?  No friction rub. No gallop.  ?Pulmonary:  ?   Effort: Pulmonary effort is normal.  ?   Breath sounds: Normal breath sounds. No wheezing, rhonchi or rales.  ?Abdominal:  ?   General: Abdomen is flat. Bowel sounds are normal.  ?   Palpations: Abdomen is soft.  ?   Tenderness: There is no abdominal tenderness. There is no guarding or rebound.  ?Genitourinary: ?   Rectum: Normal.  ?Skin: ?   General: Skin is warm and dry.  ?   Capillary Refill: Capillary refill takes less than 2 seconds.  ?   Findings: No erythema or rash.  ?Neurological:  ?   General: No focal deficit present.  ?   Mental Status: He is alert and oriented to person, place, and time.  ?Psychiatric:     ?   Mood and Affect: Mood normal.     ?   Behavior: Behavior normal.     ?   Thought Content: Thought content normal.      ?   Judgment: Judgment normal.  ? ? ? ?UC Treatments / Results  ?Labs ?(all labs ordered are listed, but only abnormal results are displayed) ?Labs Reviewed - No data to display ? ?EKG ? ? ?Radiology ?No results found. ? ?Procedures ?Procedures (including critical care time) ? ?Medications Ordered in UC ?Medications - No data to display ? ?Initial Impression / Assessment and Plan / UC Course  ?I have reviewed the triage vital signs and the nursing notes. ? ?Pertinent labs & imaging results that were available during my care of the patient were reviewed by me and considered in my medical decision making (see chart for details). ? ?The patient is a pleasant, nontoxic-appearing 36 year old male here for evaluation of rectal pain, itching, and blood when he wipes that started yesterday after having a hard bowel movement.  He had another episode today after having a hard bowel movement.  He states that for the last week his bowel movements have been hard.  When asked about his diet he states that he eats a lot of processed food and is little few juice but does not drink a lot of water or eat any vegetables.  He denies any abdominal pain, nausea, or vomiting.  No rectal bleeding just some blood when he wipes.  On exam patient has a benign cardiopulmonary exam with clear lung sounds in all fields.  Abdomen is soft and nontender with positive bowel sounds in all 4 quadrants.  Rectal exam reveals no external hemorrhoids.  There is no frank blood from the rectal opening.  DRE was deferred.  I suspect patient has internal hemorrhoids that are being irritated with his hard bowel movements.  He states that 1 time in the past he did have a knot develop on the outside of his rectal opening but it resolved on its own and has not returned.  I have suggested to the patient that he needs to increase his oral fluid intake of water to a goal of his body weight in ounces and to consume more roughage and fiber and less processed food as  the processed food is slowing down the transit time in his gut leading to his constipation.  Also the lack of water is facilitating hardening of his stools.  I have prescribed Anusol hydrocortisone suppositories that the patient can insert twice daily to help calm down the inflammation and ease the itching from the internal hemorrhoids.  I have also suggested that he take over-the-counter Colace 100 mg twice daily to help soften his stools to make his bowel movements easier and softer to pass.  If patient's symptoms do not improve he should follow-up with gastroenterology.  Return precautions reviewed. ? ? ?Final Clinical Impressions(s) / UC Diagnoses  ? ?Final diagnoses:  ?Constipation, unspecified constipation type  ?Hemorrhoids, unspecified hemorrhoid type  ? ? ? ?Discharge Instructions   ? ?  ?Increase your oral intake of fiber to add bulk to your stool and decrease constipation. ? ?Increase your water intake to at least your body weight in ounces. ? ?Take OTC Colace twice daily (100 mg) to soften your stool. ? ?Use the Hydrocortisone suppositories twice daily to help decrease inflammation and help with pain and itching.  ? ?Decrease your consumption of processed foods. ? ?If your symptoms do not improve follow-up with gastroenterology. ? ? ? ? ?ED Prescriptions   ? ? Medication Sig Dispense Auth. Provider  ? hydrocortisone (ANUSOL-HC) 25 MG suppository Place 1 suppository (25 mg total) rectally 2 (two) times daily. 12 suppository Becky Augustayan, Davene Jobin, NP  ? ?  ? ?PDMP not reviewed this encounter. ?  ?Becky Augustayan, Tearra Ouk, NP ?10/30/21 1336 ? ?

## 2021-10-30 NOTE — Discharge Instructions (Signed)
Increase your oral intake of fiber to add bulk to your stool and decrease constipation. ? ?Increase your water intake to at least your body weight in ounces. ? ?Take OTC Colace twice daily (100 mg) to soften your stool. ? ?Use the Hydrocortisone suppositories twice daily to help decrease inflammation and help with pain and itching.  ? ?Decrease your consumption of processed foods. ? ?If your symptoms do not improve follow-up with gastroenterology. ?

## 2021-10-30 NOTE — ED Triage Notes (Signed)
Patient c/o rectal pain that started yesterday.  Patient noticed some blood when he wiped after bowel movement yesterday.  Patient reports LBM was hard and firm.  Patient denies N/V.  Patient denies any urinary symptoms.  ?

## 2021-12-30 ENCOUNTER — Ambulatory Visit
Admission: EM | Admit: 2021-12-30 | Discharge: 2021-12-30 | Disposition: A | Discharge status: Home / Self Care | Payer: BC Managed Care – PPO | Attending: Emergency Medicine | Admitting: Emergency Medicine

## 2021-12-30 DIAGNOSIS — R030 Elevated blood-pressure reading, without diagnosis of hypertension: Secondary | ICD-10-CM | POA: Diagnosis present

## 2021-12-30 DIAGNOSIS — M25471 Effusion, right ankle: Secondary | ICD-10-CM | POA: Diagnosis present

## 2021-12-30 LAB — COMPREHENSIVE METABOLIC PANEL
ALT: 39 U/L (ref 0–44)
AST: 26 U/L (ref 15–41)
Albumin: 4.1 g/dL (ref 3.5–5.0)
Alkaline Phosphatase: 95 U/L (ref 38–126)
Anion gap: 7 (ref 5–15)
BUN: 8 mg/dL (ref 6–20)
CO2: 27 mmol/L (ref 22–32)
Calcium: 9.1 mg/dL (ref 8.9–10.3)
Chloride: 104 mmol/L (ref 98–111)
Creatinine, Ser: 1.16 mg/dL (ref 0.61–1.24)
GFR, Estimated: >60 mL/min (ref >60)
Glucose, Bld: 105 mg/dL — ABNORMAL HIGH (ref 70–99)
Potassium: 4.1 mmol/L (ref 3.5–5.1)
Sodium: 138 mmol/L (ref 135–145)
Total Bilirubin: 1.3 mg/dL — ABNORMAL HIGH (ref 0.3–1.2)
Total Protein: 7.9 g/dL (ref 6.5–8.1)

## 2021-12-30 LAB — URIC ACID: Uric Acid, Serum: 6.4 mg/dL (ref 3.7–8.6)

## 2021-12-30 NOTE — ED Provider Notes (Addendum)
HPI  SUBJECTIVE:  Edward ChessmanChristopher R Burns is a 36 y.o. male who presents with lateral right ankle swelling starting a week and half ago.  It was worse yesterday after doing yard work all day.  He denies pain, erythema, bruising, numbness or tingling in the foot, pain with ankle movement, fevers, trauma to the ankle, fall.  He denies any other swelling in any of his other extremities.  No calf swelling.  No surgery in the past 4 weeks, calf pain, hemoptysis, chest pain, shortness of breath, recent immobilization.  He has never had symptoms like this before.  He tried increasing his water intake and decreasing sodium intake, compression socks and elevation.  The increased water/decrease salt and elevation help.  He states that is completely resolved in the morning.  Symptoms are worse with standing, and at the end of the day.  He has a past medical history of remote severe right ankle sprain that he let heal on its own.  He also notes that his blood pressure is high today.  He states that people have talked to him about his blood pressure before, but he has no formal diagnosis of hypertension.  He does not measure his blood pressure at home.  Past medical history of gout right little toe.  No history of diabetes, chronic kidney disease, MI, coronary disease, hypercholesterolemia, PAD/PVD, DVT, PE, hypercoagulability, cancer.  PCP: None.    Past Medical History:  Diagnosis Date   Gout     Past Surgical History:  Procedure Laterality Date   NO PAST SURGERIES     WISDOM TOOTH EXTRACTION      Family History  Problem Relation Age of Onset   Diabetes Mother    Hypertension Father    Diabetes Father     Social History   Tobacco Use   Smoking status: Never   Smokeless tobacco: Never  Vaping Use   Vaping Use: Never used  Substance Use Topics   Alcohol use: Not Currently   Drug use: Yes    Frequency: 3.0 times per week    Types: Marijuana    No current facility-administered medications for  this encounter. No current outpatient medications on file.  No Known Allergies   ROS  As noted in HPI.   Physical Exam  BP (S) (!) 144/109 (BP Location: Left Arm)   Pulse 69   Temp 98.6 F (37 C) (Oral)   Resp 20   Ht 6' (1.829 m)   Wt 136.1 kg   SpO2 100%   BMI 40.69 kg/m   Constitutional: Well developed, well nourished, no acute distress Eyes:  EOMI, conjunctiva normal bilaterally HENT: Normocephalic, atraumatic,mucus membranes moist Respiratory: Normal inspiratory effort Cardiovascular: Normal rate GI: nondistended skin: No rash, skin intact Musculoskeletal: R  Ankle: Mild soft tissue swelling laterally.  No bruising, erythema.  Proximal fibula NT , Distal fibula NT , Medial malleolus NT,  Deltoid ligaments medially NT ,  ATFL NT , calcaneofibular ligament NT , posterior tablofibular ligament tender  ,  Achilles NT, calcaneus NT,  Proximal 5th metatarsal NT, Midfoot NT, distal NVI with baseline sensation / motor to foot with DP 2+.No pain  with dorsiflexion/plantar flexion. no pain with inversion/eversion.   Ant drawer test stable. Pt able to bear weight in dept. calves symmetric, bilateral trace edema.  No calf tenderness.  No palpable cord. Neurologic: Alert & oriented x 3, no focal neuro deficits Psychiatric: Speech and behavior appropriate   ED Course   Medications - No data  to display  Orders Placed This Encounter  Procedures   Comprehensive metabolic panel    Standing Status:   Standing    Number of Occurrences:   1   Uric acid    Standing Status:   Standing    Number of Occurrences:   1    Results for orders placed or performed during the hospital encounter of 12/30/21 (from the past 24 hour(s))  Comprehensive metabolic panel     Status: Abnormal   Collection Time: 12/30/21 11:01 AM  Result Value Ref Range   Sodium 138 135 - 145 mmol/L   Potassium 4.1 3.5 - 5.1 mmol/L   Chloride 104 98 - 111 mmol/L   CO2 27 22 - 32 mmol/L   Glucose, Bld 105 (H) 70 -  99 mg/dL   BUN 8 6 - 20 mg/dL   Creatinine, Ser 0.86 0.61 - 1.24 mg/dL   Calcium 9.1 8.9 - 57.8 mg/dL   Total Protein 7.9 6.5 - 8.1 g/dL   Albumin 4.1 3.5 - 5.0 g/dL   AST 26 15 - 41 U/L   ALT 39 0 - 44 U/L   Alkaline Phosphatase 95 38 - 126 U/L   Total Bilirubin 1.3 (H) 0.3 - 1.2 mg/dL   GFR, Estimated >46 >96 mL/min   Anion gap 7 5 - 15  Uric acid     Status: None   Collection Time: 12/30/21 11:01 AM  Result Value Ref Range   Uric Acid, Serum 6.4 3.7 - 8.6 mg/dL   No results found.  ED Clinical Impression  1. Ankle swelling, right   2. Elevated blood pressure reading without diagnosis of hypertension      ED Assessment/Plan  1.  Ankle swelling.  Patient reports remote history of injury to this ankle.  It could be flaring up from that.  No evidence of gout, infection, pseudogout in the absence of erythema, pain, but will check a uric acid.  Wells score for DVT score -2.  PERC negative.  There is no evidence of DVT.  Will have patient continue compression socks, increase water, decrease sodium in diet.  Uric acid, CMP normal.  Total bilirubin slightly elevated, but doubt this is clinically significant.  Discussed with patient that I would contact him only if his labs are abnormal and we need to change management.  2.  Elevated blood pressure.  Patient does not measure his blood pressure at home.  He has no formal diagnosis of hypertension.  He has no historical evidence of end organ damage.  Kidney function is normal.  Pt denies any CNS type sx such as HA, visual changes, focal paresis, or new onset seizure activity. Pt denies any CV sx such as CP, dyspnea, palpitations, pedal edema, tearing pain radiating to back or abd. Pt denied any renal sx such as anuria or hematuria. Pt denies illicit drug use, most notably cocaine. Discussed importance of lifestyle modifications as or follow-up with PCP of his choice in 2-week for further management if his blood pressure remains elevated above  130/80.  Provided primary care list and ordered assistance in finding a PCP.  Discussed labs, MDM, treatment plan, and plan for follow-up with patient. Discussed sn/sx that should prompt return to the ED. patient agrees with plan.   No orders of the defined types were placed in this encounter.     *This clinic note was created using Dragon dictation software. Therefore, there may be occasional mistakes despite careful proofreading.  ?  Domenick Gong, MD 12/30/21 1317    Domenick Gong, MD 12/30/21 [phone]

## 2021-12-30 NOTE — ED Notes (Signed)
Providers notified of elevated BP

## 2021-12-30 NOTE — Discharge Instructions (Addendum)
Contact you if and only if your labs are abnormal.  Please answer phone calls from unknown numbers for the next few days continue elevation, increase fluids, low-sodium diet.  Decrease your salt intake. diet and exercise will lower your blood pressure significantly. It is important to keep your blood pressure under good control, as having a elevated blood pressure for prolonged periods of time significantly increases your risk of stroke, heart attacks, kidney damage, eye damage, and other problems. Measure your blood pressure once a day, preferably at the same time every day. Keep a log of this and bring it to your next doctor's appointment.  Bring your blood pressure cuff as well.  Return here in 2 weeks for blood pressure recheck if you're unable to find a primary care physician by then. Return immediately to the ER if you start having chest pain, headache, problems seeing, problems talking, problems walking, if you feel like you're about to pass out, if you do pass out, if you have a seizure, or for any other concerns.  Here is a list of primary care providers who are taking new patients:  [facility] [place] Dr. Joseph Berkshire (sports medicine) Dr. Elizabeth Sauer [address] [place] [address] [phone]  [facility] at [facility] [address] [place], [place] [address] [phone]  [facility] [place] [address]  [place] [address]  [phone]  [facility] [address] [place], [place] [address] [phone]  [facility] [address] [place]  [phone] [place], [place] [address]  Here are clinics/ other resources who will see you if you do not have insurance. Some have certain criteria that you must meet. Call them and find out what they are:  [facility]: [address] [place], [place] [address] Phone: [phone] Hours: First and Third Saturdays of each Month, 9 a.m. - 1 p.m.  [facility]: [address]  [address] [place], [place] [address] Phone: [phone] Hours: Tuesday, 4 p.m. - 8 p.m. Thursday, 1 p.m. - 8 p.m. Wednesday, 9 a.m. - [facility] [address] [place], [place] [address] Phone: [phone] Pharmacy Phone Number: [phone] Dental Phone Number: [phone] [facility] Insurance Help: [phone]  Dental Hours: Monday - Thursday, 8 a.m. - 6 p.m.  [facility] [address] [place], [place] [address] Phone: [phone] Pharmacy Phone Number: [phone] [facility] Insurance Help: [phone]  [facility] [address] [place], [place] [address] Phone: [phone] Pharmacy Phone Number: [phone] [facility] Insurance Help: [phone]  [facility] [address] [place], [place] [address] Phone: [phone] [facility] Insurance Help: [phone]   [facility] [address], [place], [place] [address] Phone: [phone]  Go to www.goodrx.com  or www.costplusdrugs.com to look up your medications. This will give you a list of where you can find your prescriptions at the most affordable prices. Or ask the pharmacist what the cash price is, or if they have any other discount programs available to help make your medication more affordable. This can be less expensive than what you would pay with insurance.

## 2021-12-30 NOTE — ED Triage Notes (Signed)
Patient presents to UC for right ankle swelling -- started about a week ago.  He started he cut out on salt and increased fluids.  Patient denies any injury to that ankle.

## 2022-05-02 ENCOUNTER — Emergency Department
Admission: EM | Admit: 2022-05-02 | Discharge: 2022-05-02 | Disposition: A | Discharge status: Home / Self Care | Payer: BC Managed Care – PPO | Attending: Emergency Medicine | Admitting: Emergency Medicine

## 2022-05-02 ENCOUNTER — Other Ambulatory Visit: Payer: Self-pay

## 2022-05-02 DIAGNOSIS — L03213 Periorbital cellulitis: Secondary | ICD-10-CM | POA: Insufficient documentation

## 2022-05-02 DIAGNOSIS — H5711 Ocular pain, right eye: Secondary | ICD-10-CM | POA: Diagnosis present

## 2022-05-02 MED ORDER — CEFDINIR 300 MG PO CAPS: 300.0000 mg | ORAL_CAPSULE | Freq: Two times a day (BID) | ORAL | 0 refills | Status: AC

## 2022-05-02 MED ORDER — SULFAMETHOXAZOLE-TRIMETHOPRIM 800-160 MG PO TABS: 1.0000 | ORAL_TABLET | Freq: Two times a day (BID) | ORAL | 0 refills | Status: AC

## 2022-05-02 NOTE — Discharge Instructions (Addendum)
Can use warm compresses over the skin.  Take the antibiotics.  Call [facility] to make a follow-up appointment and return to the ER if develop pain with eye movement, changes in vision, inability to open your eye or any other concerns

## 2022-05-02 NOTE — ED Triage Notes (Signed)
Pt comes with c/o right eye pain that started yesterday. Pt states he woke up this am and it was swollen shut. Pt eye is swollen shut and he states he is unable to open it.

## 2022-05-02 NOTE — ED Provider Notes (Signed)
   [facility] Provider Note    Event Date/Time   First MD Initiated Contact with Patient 05/02/22 1038     (approximate)   History   Eye Pain   HPI  Edward Burns is a 36 y.o. male who is otherwise healthy comes in with right eye swelling.  Patient reports having 3 days of increasing swelling of the right eye.  He denies any vision changes.  He denies any pain with moving his eye.  Denies this ever happening previously.  Denies anything getting stuck in his eye.  Denies any fevers or other concerns  Physical Exam   Triage Vital Signs: ED Triage Vitals  Enc Vitals Group     BP 05/02/22 0908 (!) 153/94     Pulse Rate 05/02/22 0908 62     Resp 05/02/22 0908 18     Temp 05/02/22 0908 98 F (36.7 C)     Temp src --      SpO2 05/02/22 0908 95 %     Weight --      Height --      Head Circumference --      Peak Flow --      Pain Score 05/02/22 0855 7     Pain Loc --      Pain Edu? --      Excl. in [place]? --     Most recent vital signs: Vitals:   05/02/22 0908  BP: (!) 153/94  Pulse: 62  Resp: 18  Temp: 98 F (36.7 C)  SpO2: 95%     General: Awake, no distress.  CV:  Good peripheral perfusion.  Resp:  Normal effort.  Abd:  No distention.  Other:  Patient has some swelling noted to the outside of the eye mostly on the upper lid but a little bit on the lower lid.  No proptosis.  No chemosis.  Eye movements are intact.  No pain with eye movements.  Patient's vision is 20/15 denies any blurred vision or double vision.   ED Results / Procedures / Treatments    IMPRESSION / MDM / [place] / ED COURSE  I reviewed the triage vital signs and the nursing notes.   Patient's presentation is most consistent with acute, uncomplicated illness.   Patient comes in with right eye outer swelling.  Suspect this is most likely a preseptal cellulitis.  I considered the possibility of orbital cellulitis but he has no concerning symptoms we  discussed CT imaging of opted to hold off but he understands return precautions in regards to this.  Patient record and to follow-up with eye doctor and to return if symptoms are worsening.  He expressed understanding and felt comfortable with plan.     FINAL CLINICAL IMPRESSION(S) / ED DIAGNOSES   Final diagnoses:  Preseptal cellulitis of right eye     Rx / DC Orders   ED Discharge Orders          Ordered    sulfamethoxazole-trimethoprim (BACTRIM DS) 800-160 MG tablet  2 times daily        05/02/22 1105    cefdinir (OMNICEF) 300 MG capsule  2 times daily        05/02/22 1105             Note:  This document was prepared using Dragon voice recognition software and may include unintentional dictation errors.   Vanessa [place], MD 05/02/22 [phone]
